# Patient Record
Sex: Male | Born: 1945 | Race: White | Hispanic: No | Marital: Married | Smoking: Former smoker
Health system: Southern US, Community
[De-identification: ages and names within clinical notes are randomized; demographics above are authoritative.]

## PROBLEM LIST (undated history)

## (undated) DIAGNOSIS — G4733 Obstructive sleep apnea (adult) (pediatric): Secondary | ICD-10-CM

## (undated) DIAGNOSIS — F419 Anxiety disorder, unspecified: Secondary | ICD-10-CM

## (undated) DIAGNOSIS — K219 Gastro-esophageal reflux disease without esophagitis: Secondary | ICD-10-CM

## (undated) DIAGNOSIS — H9319 Tinnitus, unspecified ear: Secondary | ICD-10-CM

## (undated) DIAGNOSIS — I10 Essential (primary) hypertension: Secondary | ICD-10-CM

## (undated) DIAGNOSIS — E785 Hyperlipidemia, unspecified: Secondary | ICD-10-CM

## (undated) DIAGNOSIS — J189 Pneumonia, unspecified organism: Secondary | ICD-10-CM

## (undated) DIAGNOSIS — C801 Malignant (primary) neoplasm, unspecified: Secondary | ICD-10-CM

## (undated) DIAGNOSIS — M1990 Unspecified osteoarthritis, unspecified site: Secondary | ICD-10-CM

## (undated) DIAGNOSIS — I2510 Atherosclerotic heart disease of native coronary artery without angina pectoris: Secondary | ICD-10-CM

## (undated) DIAGNOSIS — D509 Iron deficiency anemia, unspecified: Secondary | ICD-10-CM

## (undated) DIAGNOSIS — F32A Depression, unspecified: Secondary | ICD-10-CM

## (undated) DIAGNOSIS — R2689 Other abnormalities of gait and mobility: Secondary | ICD-10-CM

## (undated) DIAGNOSIS — M545 Low back pain, unspecified: Secondary | ICD-10-CM

## (undated) HISTORY — PX: DG BARIUM SWALLOW (ARMC HX): HXRAD1448

## (undated) HISTORY — PX: SIGMOIDOSCOPY: SUR1295

## (undated) HISTORY — DX: Obstructive sleep apnea (adult) (pediatric): G47.33

## (undated) HISTORY — DX: Hyperlipidemia, unspecified: E78.5

## (undated) HISTORY — DX: Atherosclerotic heart disease of native coronary artery without angina pectoris: I25.10

## (undated) HISTORY — DX: Low back pain, unspecified: M54.50

## (undated) HISTORY — DX: Other abnormalities of gait and mobility: R26.89

## (undated) HISTORY — PX: SKIN CANCER EXCISION: SHX779

## (undated) HISTORY — PX: UPPER GI ENDOSCOPY: SHX6162

---

## 1999-03-26 ENCOUNTER — Ambulatory Visit (HOSPITAL_COMMUNITY): Admission: RE | Admit: 1999-03-26 | Discharge: 1999-03-26 | Payer: Self-pay | Admitting: Cardiology

## 1999-03-29 ENCOUNTER — Encounter: Payer: Self-pay | Admitting: Thoracic Surgery (Cardiothoracic Vascular Surgery)

## 1999-04-02 ENCOUNTER — Encounter: Payer: Self-pay | Admitting: Thoracic Surgery (Cardiothoracic Vascular Surgery)

## 1999-04-02 ENCOUNTER — Inpatient Hospital Stay (HOSPITAL_COMMUNITY)
Admission: RE | Admit: 1999-04-02 | Discharge: 1999-04-07 | Payer: Self-pay | Admitting: Thoracic Surgery (Cardiothoracic Vascular Surgery)

## 1999-04-02 HISTORY — PX: CORONARY ARTERY BYPASS GRAFT: SHX141

## 1999-04-03 ENCOUNTER — Encounter: Payer: Self-pay | Admitting: Thoracic Surgery (Cardiothoracic Vascular Surgery)

## 1999-04-04 ENCOUNTER — Encounter: Payer: Self-pay | Admitting: Thoracic Surgery (Cardiothoracic Vascular Surgery)

## 1999-04-06 ENCOUNTER — Encounter: Payer: Self-pay | Admitting: Thoracic Surgery (Cardiothoracic Vascular Surgery)

## 2000-05-23 ENCOUNTER — Encounter: Admission: RE | Admit: 2000-05-23 | Discharge: 2000-05-23 | Payer: Self-pay | Admitting: General Surgery

## 2000-05-23 ENCOUNTER — Encounter: Payer: Self-pay | Admitting: General Surgery

## 2000-05-27 ENCOUNTER — Ambulatory Visit (HOSPITAL_BASED_OUTPATIENT_CLINIC_OR_DEPARTMENT_OTHER): Admission: RE | Admit: 2000-05-27 | Discharge: 2000-05-27 | Payer: Self-pay | Admitting: General Surgery

## 2000-05-27 ENCOUNTER — Encounter (INDEPENDENT_AMBULATORY_CARE_PROVIDER_SITE_OTHER): Payer: Self-pay | Admitting: *Deleted

## 2003-01-19 ENCOUNTER — Observation Stay (HOSPITAL_COMMUNITY): Admission: EM | Admit: 2003-01-19 | Discharge: 2003-01-20 | Payer: Self-pay | Admitting: Emergency Medicine

## 2003-01-19 ENCOUNTER — Encounter: Payer: Self-pay | Admitting: Emergency Medicine

## 2004-03-30 ENCOUNTER — Ambulatory Visit (HOSPITAL_COMMUNITY): Admission: RE | Admit: 2004-03-30 | Discharge: 2004-03-30 | Payer: Self-pay | Admitting: *Deleted

## 2004-06-17 ENCOUNTER — Emergency Department (HOSPITAL_COMMUNITY): Admission: EM | Admit: 2004-06-17 | Discharge: 2004-06-17 | Payer: Self-pay | Admitting: Emergency Medicine

## 2004-11-25 HISTORY — PX: FISSURECTOMY: SHX5244

## 2004-12-15 ENCOUNTER — Emergency Department (HOSPITAL_COMMUNITY): Admission: EM | Admit: 2004-12-15 | Discharge: 2004-12-15 | Payer: Self-pay | Admitting: Emergency Medicine

## 2005-03-02 ENCOUNTER — Emergency Department (HOSPITAL_COMMUNITY): Admission: EM | Admit: 2005-03-02 | Discharge: 2005-03-02 | Payer: Self-pay | Admitting: Emergency Medicine

## 2005-12-11 ENCOUNTER — Emergency Department (HOSPITAL_COMMUNITY): Admission: EM | Admit: 2005-12-11 | Discharge: 2005-12-12 | Payer: Self-pay | Admitting: Emergency Medicine

## 2007-08-07 ENCOUNTER — Encounter: Admission: RE | Admit: 2007-08-07 | Discharge: 2007-08-07 | Payer: Self-pay | Admitting: Endocrinology

## 2007-08-27 ENCOUNTER — Encounter: Admission: RE | Admit: 2007-08-27 | Discharge: 2007-08-27 | Payer: Self-pay | Admitting: Endocrinology

## 2008-04-26 ENCOUNTER — Encounter: Admission: RE | Admit: 2008-04-26 | Discharge: 2008-04-26 | Payer: Self-pay | Admitting: *Deleted

## 2008-08-29 ENCOUNTER — Encounter (INDEPENDENT_AMBULATORY_CARE_PROVIDER_SITE_OTHER): Payer: Self-pay | Admitting: *Deleted

## 2008-08-29 ENCOUNTER — Ambulatory Visit (HOSPITAL_COMMUNITY): Admission: RE | Admit: 2008-08-29 | Discharge: 2008-08-29 | Payer: Self-pay | Admitting: *Deleted

## 2010-12-15 ENCOUNTER — Encounter: Payer: Self-pay | Admitting: Cardiology

## 2011-04-09 NOTE — Op Note (Signed)
[name], Timothy             ACCOUNT NO.:  [id number]   MEDICAL RECORD NO.:  [id number]          PATIENT TYPE:  AMB   LOCATION:  ENDO                         FACILITY:  [facility]   PHYSICIAN:  Georgiana Spinner, M.D.    DATE OF BIRTH:  [birth year]   DATE OF PROCEDURE:  DATE OF DISCHARGE:                               OPERATIVE REPORT   PROCEDURE:  Colonoscopy.   INDICATIONS:  Rectal bleeding.   ANESTHESIA:  Versed 1 mg.   PROCEDURE:  With the patient mildly sedated in the left lateral  decubitus position, a rectal exam was performed, which was unremarkable.  Subsequently the Pentax videoscopic pediatric colonoscope was inserted  into the rectum and passed under direct vision to the cecum, identified  by the ileocecal valve and appendiceal orifice, both of which were  photographed.  From this point the colonoscope was slowly withdrawn,  taking circumferential views of the colonic mucosa, stopping only in the  ascending colon near the hepatic flexure where there was a small polyp  seen, photographed, and removed first using snare cautery technique,  subsequently using hot biopsy forceps to remove the remainder of the  tissue and cauterize any remaining polypoid tissue.  As noted, from this  point, the colonoscope was then slowly withdrawn, taking circumferential  views of the remaining colonic mucosa,  stopping in the rectum, which  appeared normal on direct and showed hemorrhoids on retroflexed view.  The endoscope was straightened and withdrawn.  The patient's vital  signs, pulse oximeter remained stable.  The patient tolerated the  procedure well without apparent complications.   FINDINGS:  Polyp of the ascending colon, otherwise an unremarkable  examination other than internal hemorrhoids.   PLAN:  Await biopsy report.  The patient will call me for results and  follow up with me as needed as an outpatient.           ______________________________  Georgiana Spinner, M.D.     GMO/MEDQ  D:  08/29/2008  T:  08/30/2008  Job:  [id number]

## 2011-04-09 NOTE — Op Note (Signed)
NAMERODGERS, LIKES             ACCOUNT NO.:  [id number]   MEDICAL RECORD NO.:  [id number]          PATIENT TYPE:  AMB   LOCATION:  ENDO                         [facility]ACILITY:  [facility]   PHYSICIAN:  Georgiana Spinner, M.D.    DATE OF BIRTH:  Sep 25, 1946   DATE OF PROCEDURE:  08/29/2008  DATE OF DISCHARGE:                               OPERATIVE REPORT   PROCEDURE:  Upper endoscopy.   INDICATIONS:  Gastroesophageal reflux disease, rectal bleeding.   ANESTHESIA:  Fentanyl 75 mcg, Versed 5 mg.   PROCEDURE:  With the patient mildly sedated in the left lateral  decubitus position, the Pentax videoscopic endoscope was inserted in the  mouth, passed under direct vision through the esophagus which appeared  normal into the stomach.  Fundus, body, antrum, duodenal bulb, second  portion duodenum all appeared normal.  From this point the endoscope was  slowly withdrawn taking circumferential views of duodenal mucosa until  the endoscope had been pulled back into the stomach, placed in  retroflexion to view the stomach from below.  The endoscope was  straightened and withdrawn taking circumferential views of the remaining  gastric and esophageal mucosa.  The patient's vital signs, pulse  oximeter remained stable.  The patient tolerated procedure well without  apparent complications.   FINDINGS:  Normal exam.   PLAN:  Proceed to colonoscopy.           ______________________________  Georgiana Spinner, M.D.     GMO/MEDQ  D:  08/29/2008  T:  08/30/2008  Job:  [id number]

## 2011-04-12 NOTE — Op Note (Signed)
NAME:  Timothy Gates, Timothy Gates                       ACCOUNT NO.:  [id number]   MEDICAL RECORD NO.:  [id number]                   PATIENT TYPE:  AMB   LOCATION:  ENDO                                 FACILITY:  [facility]   PHYSICIAN:  Georgiana Spinner, M.D.                 DATE OF BIRTH:  November 29, 1945   DATE OF PROCEDURE:  DATE OF DISCHARGE:                                 OPERATIVE REPORT   PROCEDURE:  Colonoscopy.   INDICATIONS:  Rectal bleeding.   ANESTHESIA:  Demerol 60 mg, Versed 6 mg.   DESCRIPTION OF PROCEDURE:  With the patient mildly sedated and in the left  lateral decubitus position, the Olympus videoscopic colonoscope was inserted  in the rectum and passed under direct vision to the cecum, identified by  ileocecal valve and appendiceal orifice, both of which were photographed.  From this point,  the colonoscope was slowly withdrawn, taking  circumferential views of the entire colonic mucosa, stopping in the rectum  on direct and retroflexed view.  The endoscope was straightened and  withdrawn.  The patient's vital signs and pulse oximetry remained stable.  The patient tolerated the procedure well without apparent complications.   FINDINGS:  Possibly internal hemorrhoids; otherwise unremarkable exam.   PLAN:  Repeat in 5-10 years.                                               Georgiana Spinner, M.D.    GMO/MEDQ  D:  03/30/2004  T:  03/30/2004  Job:  [id number]

## 2011-04-12 NOTE — Op Note (Signed)
[place]  Patient:    Timothy Gates, Timothy Gates                    MRN: [id number] Proc. Date: 05/27/00 Adm. 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 3. Anal tags.  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 3. Anal tags.  OPERATION/PROCEDURE: 1. Attempted flexible sigmoidoscopy. 2. Excision of external tags. 3. Repair of anal fissure.  ANESTHESIA:  General anesthesia with LMA.  SURGEON:  Timothy E. Earlene Plater, M.D.  INDICATION FOR PROCEDURE:  Mr. Burkhalter has been seen and followed in the office. He has a long history of recurrent anal fissure.  He has several external tags and severe pruritus ani.  He has elected to proceed with surgery after careful explanation of the procedure.  DESCRIPTION OF PROCEDURE:  The patient was brought to the operating room and placed in the supine position. IV was started.  General anesthesia was induced with LMA.  He was placed in the lithotomy position. I attempted to do a flexible sigmoidoscopy but had complete failure of the equipment. The scope was withdrawn.  The liquid stool suctioned away.  The perianal area was prepped and draped in the usual fashion.  Anal fissure was present posteriorly. Chronic heaped up rolled edges and several skin tags were present. on the perianal skin.  There was no stenosis.  Pruritus ani and hyperkeratosis of the skin was marked.  I injected 0.5% Marcaine with epinephrine around and about the anal orifice and massaged in well.  A percutaneous internal sphincterotomy was accomplished in the left posterior position without injury to the external sphincter.  Both sphincters could be easily palpated.  The external skin tags were then removed by simply cutting them smooth and flatly away from the surrounding skin. The anus was gently dilated  and the operating anoscope placed.  No pathology was noted internally. The fissure was cauterized and the cut skin where the tags had been were closed with interrupted 3-0 chromic sutures.  This left a very satisfactory external appearance.  This completed the procedure.  Gelfoam gauze and dry sterile dressing applied.  He tolerated the procedure well and was removed to the recovery room in good condition. DD:  05/27/00 TD:  05/27/00 Job: [id number]

## 2011-05-09 ENCOUNTER — Other Ambulatory Visit: Payer: Self-pay | Admitting: Dermatology

## 2011-07-11 ENCOUNTER — Other Ambulatory Visit: Payer: Self-pay | Admitting: Dermatology

## 2012-02-05 DIAGNOSIS — R310 Gross hematuria: Secondary | ICD-10-CM | POA: Diagnosis not present

## 2012-02-06 DIAGNOSIS — E789 Disorder of lipoprotein metabolism, unspecified: Secondary | ICD-10-CM | POA: Diagnosis not present

## 2012-02-13 DIAGNOSIS — I10 Essential (primary) hypertension: Secondary | ICD-10-CM | POA: Diagnosis not present

## 2012-02-13 DIAGNOSIS — E789 Disorder of lipoprotein metabolism, unspecified: Secondary | ICD-10-CM | POA: Diagnosis not present

## 2012-02-13 DIAGNOSIS — E291 Testicular hypofunction: Secondary | ICD-10-CM | POA: Diagnosis not present

## 2012-03-19 DIAGNOSIS — R5381 Other malaise: Secondary | ICD-10-CM | POA: Diagnosis not present

## 2012-03-19 DIAGNOSIS — R112 Nausea with vomiting, unspecified: Secondary | ICD-10-CM | POA: Diagnosis not present

## 2012-03-19 DIAGNOSIS — R197 Diarrhea, unspecified: Secondary | ICD-10-CM | POA: Diagnosis not present

## 2012-03-19 DIAGNOSIS — R5383 Other fatigue: Secondary | ICD-10-CM | POA: Diagnosis not present

## 2012-03-24 DIAGNOSIS — I2510 Atherosclerotic heart disease of native coronary artery without angina pectoris: Secondary | ICD-10-CM | POA: Diagnosis not present

## 2012-03-24 DIAGNOSIS — E782 Mixed hyperlipidemia: Secondary | ICD-10-CM | POA: Diagnosis not present

## 2012-04-01 ENCOUNTER — Other Ambulatory Visit: Payer: Self-pay | Admitting: Dermatology

## 2012-04-01 DIAGNOSIS — L821 Other seborrheic keratosis: Secondary | ICD-10-CM | POA: Diagnosis not present

## 2012-04-01 DIAGNOSIS — L820 Inflamed seborrheic keratosis: Secondary | ICD-10-CM | POA: Diagnosis not present

## 2012-04-01 DIAGNOSIS — C44621 Squamous cell carcinoma of skin of unspecified upper limb, including shoulder: Secondary | ICD-10-CM | POA: Diagnosis not present

## 2012-04-01 DIAGNOSIS — L570 Actinic keratosis: Secondary | ICD-10-CM | POA: Diagnosis not present

## 2012-04-13 ENCOUNTER — Emergency Department (HOSPITAL_COMMUNITY): Payer: Medicare Other

## 2012-04-13 ENCOUNTER — Encounter (HOSPITAL_COMMUNITY): Payer: Self-pay | Admitting: *Deleted

## 2012-04-13 ENCOUNTER — Emergency Department (HOSPITAL_COMMUNITY)
Admission: EM | Admit: 2012-04-13 | Discharge: 2012-04-13 | Disposition: A | Discharge status: Home / Self Care | Payer: Medicare Other | Attending: Emergency Medicine | Admitting: Emergency Medicine

## 2012-04-13 DIAGNOSIS — W19XXXA Unspecified fall, initial encounter: Secondary | ICD-10-CM | POA: Diagnosis not present

## 2012-04-13 DIAGNOSIS — Z951 Presence of aortocoronary bypass graft: Secondary | ICD-10-CM | POA: Diagnosis not present

## 2012-04-13 DIAGNOSIS — I700 Atherosclerosis of aorta: Secondary | ICD-10-CM | POA: Diagnosis not present

## 2012-04-13 DIAGNOSIS — Z79899 Other long term (current) drug therapy: Secondary | ICD-10-CM | POA: Diagnosis not present

## 2012-04-13 DIAGNOSIS — R1012 Left upper quadrant pain: Secondary | ICD-10-CM | POA: Diagnosis not present

## 2012-04-13 DIAGNOSIS — R079 Chest pain, unspecified: Secondary | ICD-10-CM | POA: Insufficient documentation

## 2012-04-13 DIAGNOSIS — S20219A Contusion of unspecified front wall of thorax, initial encounter: Secondary | ICD-10-CM | POA: Diagnosis not present

## 2012-04-13 DIAGNOSIS — Z7982 Long term (current) use of aspirin: Secondary | ICD-10-CM | POA: Insufficient documentation

## 2012-04-13 DIAGNOSIS — W1809XA Striking against other object with subsequent fall, initial encounter: Secondary | ICD-10-CM | POA: Insufficient documentation

## 2012-04-13 DIAGNOSIS — IMO0002 Reserved for concepts with insufficient information to code with codable children: Secondary | ICD-10-CM | POA: Diagnosis not present

## 2012-04-13 DIAGNOSIS — R109 Unspecified abdominal pain: Secondary | ICD-10-CM | POA: Diagnosis not present

## 2012-04-13 LAB — CBC
Hemoglobin: 13.1 g/dL (ref 13.0–17.0)
MCH: 28.9 pg (ref 26.0–34.0)
MCHC: 34.6 g/dL (ref 30.0–36.0)
MCV: 83.5 fL (ref 78.0–100.0)
RBC: 4.54 MIL/uL (ref 4.22–5.81)

## 2012-04-13 LAB — DIFFERENTIAL
Basophils Relative: 0 % (ref 0–1)
Eosinophils Absolute: 0.1 10*3/uL (ref 0.0–0.7)
Eosinophils Relative: 2 % (ref 0–5)
Lymphs Abs: 1.8 10*3/uL (ref 0.7–4.0)
Monocytes Relative: 6 % (ref 3–12)

## 2012-04-13 LAB — POCT I-STAT, CHEM 8
Calcium, Ion: 1.21 mmol/L (ref 1.12–1.32)
Creatinine, Ser: 0.8 mg/dL (ref 0.50–1.35)
Glucose, Bld: 105 mg/dL — ABNORMAL HIGH (ref 70–99)
HCT: 39 % (ref 39.0–52.0)
Hemoglobin: 13.3 g/dL (ref 13.0–17.0)
TCO2: 23 mmol/L (ref 0–100)

## 2012-04-13 MED ORDER — NAPROXEN 500 MG PO TABS: 500.0000 mg | ORAL_TABLET | Freq: Two times a day (BID) | ORAL | Status: AC

## 2012-04-13 MED ORDER — OXYCODONE-ACETAMINOPHEN 5-325 MG PO TABS: 1.0000 | ORAL_TABLET | Freq: Four times a day (QID) | ORAL | Status: AC | PRN

## 2012-04-13 MED ORDER — OXYCODONE-ACETAMINOPHEN 5-325 MG PO TABS: 1.0000 | ORAL_TABLET | Freq: Four times a day (QID) | ORAL | Status: DC | PRN

## 2012-04-13 MED ORDER — IOHEXOL 350 MG/ML SOLN
100.0000 mL | Freq: Once | INTRAVENOUS | Status: AC | PRN
  Administered 2012-04-13: 100 mL via INTRAVENOUS

## 2012-04-13 MED ORDER — HYDROMORPHONE HCL PF 1 MG/ML IJ SOLN
1.0000 mg | Freq: Once | INTRAMUSCULAR | Status: AC
  Administered 2012-04-13: 1 mg via INTRAVENOUS
  Filled 2012-04-13: qty 1

## 2012-04-13 NOTE — Discharge Instructions (Signed)
Chest Contusion You have been checked for injuries to your chest. Your caregiver has not found injuries serious enough to require hospitalization. It is common to have bruises and sore muscles after an injury. These tend to feel worse the first 24 hours. You may gradually develop more stiffness and soreness over the next several hours to several days. This usually feels worse the first morning following your injury. After a few days, you will usually begin to improve. The amount of improvement depends on the amount of damage. Following the accident, if the pain in any area continues to increase or you develop new areas of pain, you should see your primary caregiver or return to the Emergency Department for re-evaluation. HOME CARE INSTRUCTIONS   Put ice on sore areas every 2 hours for 20 minutes while awake for the next 2 days.   Drink extra fluids. Do not drink alcohol.   Activity as tolerated. Lifting may make pain worse.   Only take over-the-counter or prescription medicines for pain, discomfort, or fever as directed by your caregiver. Do not use aspirin. This may increase bruising or increase bleeding.  SEEK IMMEDIATE MEDICAL CARE IF:   There is a worsening of any of the problems that brought you in for care.   Shortness of breath, dizziness or fainting develop.   You have chest pain, difficulty breathing, or develop pain going down the left arm or up into jaw.   You feel sick to your stomach (nausea), vomiting or sweats.   You have increasing belly (abdominal) discomfort.   There is blood in your urine, stool, or if you vomit blood.   There is pain in either shoulder in an area where a shoulder strap would be.   You have feelings of lightheadedness, or if you should have a fainting episode.   You have numbness, tingling, weakness, or problems with the use of your arms or legs.   Severe headaches not relieved with medications develop.   You have a change in bowel or bladder  control.   There is increasing pain in any areas of the body.  If you feel your symptoms are worsening, and you are not able to see your primary caregiver, return to the Emergency Department immediately. MAKE SURE YOU:   Understand these instructions.   Will watch your condition.   Will get help right away if you are not doing well or get worse.  Document Released: 08/06/2001 Document Revised: 10/31/2011 Document Reviewed: 06/29/2008 Exit[employer] Patient Information 2012 [employer]. 

## 2012-04-13 NOTE — ED Provider Notes (Signed)
History     CSN: [id number]  Arrival date & time 04/13/12  1625   First MD Initiated Contact with Patient 04/13/12 1639      Chief Complaint  Patient presents with  . Fall  . Rib Injury  . Chest Injury    (Consider location/radiation/quality/duration/timing/severity/associated sxs/prior treatment) Patient is a 66 y.o. male presenting with fall. The history is provided by the patient. No language interpreter was used.  Fall The accident occurred 1 to 2 hours ago. The fall occurred while walking (fell onto a chair with a leg in the air - struck him in chest). He fell from a height of 1 to 2 ft. He landed on a hard floor. There was no blood loss. Point of impact: chest. Pain location: chest. The pain is moderate. He was ambulatory at the scene. There was no entrapment after the fall. There was no drug use involved in the accident. There was no alcohol use involved in the accident. Pertinent negatives include no fever, no numbness, no abdominal pain, no bowel incontinence, no nausea, no vomiting, no headaches, no hearing loss and no loss of consciousness. The symptoms are aggravated by activity and standing.    History reviewed. No pertinent past medical history.  History reviewed. No pertinent past surgical history.  History reviewed. No pertinent family history.  History  Substance Use Topics  . Smoking status: Former Smoker    Types: Cigarettes  . Smokeless tobacco: Not on file  . Alcohol Use: No      Review of Systems  Constitutional: Negative for fever, activity change, appetite change and fatigue.  HENT: Negative for congestion, sore throat, rhinorrhea, neck pain and neck stiffness.   Respiratory: Negative for cough, chest tightness and shortness of breath.   Cardiovascular: Positive for chest pain. Negative for palpitations.  Gastrointestinal: Negative for nausea, vomiting, abdominal pain and bowel incontinence.  Genitourinary: Negative for dysuria, urgency, frequency  and flank pain.  Musculoskeletal: Negative for myalgias, back pain and arthralgias.  Neurological: Negative for dizziness, loss of consciousness, weakness, light-headedness, numbness and headaches.  All other systems reviewed and are negative.    Allergies  Benadryl  Home Medications   Current Outpatient Rx  Name Route Sig Dispense Refill  . ASPIRIN 81 MG PO CHEW Oral Chew 81 mg by mouth daily.    . ATORVASTATIN CALCIUM 40 MG PO TABS Oral Take 40 mg by mouth daily.    . CYANOCOBALAMIN 500 MCG PO TABS Oral Take 500 mcg by mouth daily.    Marland Kitchen FINASTERIDE 5 MG PO TABS Oral Take 5 mg by mouth daily.    Marland Kitchen METOPROLOL SUCCINATE ER 50 MG PO TB24 Oral Take 25 mg by mouth daily. Take with or immediately following a meal.    . PANTOPRAZOLE SODIUM 40 MG PO TBEC Oral Take 40 mg by mouth daily.    . SERTRALINE HCL 50 MG PO TABS Oral Take 50 mg by mouth daily.    Marland Kitchen ZOLPIDEM TARTRATE ER 12.5 MG PO TBCR Oral Take 12.5 mg by mouth at bedtime as needed. For sleep    . NAPROXEN 500 MG PO TABS Oral Take 1 tablet (500 mg total) by mouth 2 (two) times daily. 30 tablet 0  . OXYCODONE-ACETAMINOPHEN 5-325 MG PO TABS Oral Take 1-2 tablets by mouth every 6 (six) hours as needed for pain. 20 tablet 0    BP 164/66  Pulse 81  Temp(Src) 98.2 F (36.8 C) (Oral)  Resp 20  SpO2 98%  Physical  Exam  Nursing note and vitals reviewed. Constitutional: He is oriented to person, place, and time. He appears well-developed and well-nourished.       Appears to be in moderate amount of pain  HENT:  Head: Normocephalic and atraumatic.  Mouth/Throat: Oropharynx is clear and moist.  Eyes: Conjunctivae and EOM are normal. Pupils are equal, round, and reactive to light.  Neck: Normal range of motion. Neck supple.  Cardiovascular: Normal rate, regular rhythm, normal heart sounds and intact distal pulses.  Exam reveals no gallop and no friction rub.   No murmur heard. Pulmonary/Chest: Effort normal and breath sounds normal. No  respiratory distress. He exhibits tenderness (L lower anterolateral chest wall and parasternal area).       Prior median sternotomy  Abdominal: Soft. Bowel sounds are normal. There is tenderness (LUQ). There is no rebound and no guarding.  Musculoskeletal: Normal range of motion. He exhibits no tenderness.  Neurological: He is alert and oriented to person, place, and time. No cranial nerve deficit.  Skin: Skin is warm and dry.       Abrasion near xiphoid process    ED Course  Procedures (including critical care time)  Labs Reviewed  CBC - Abnormal; Notable for the following:    HCT 37.9 (*)    All other components within normal limits  POCT I-STAT, CHEM 8 - Abnormal; Notable for the following:    Glucose, Bld 105 (*)    All other components within normal limits  DIFFERENTIAL   Dg Chest 2 View  04/13/2012  *RADIOLOGY REPORT*  Clinical Data: Larey Seat onto chair, left chest pain, heard a crack in his chest  CHEST - 2 VIEW  Comparison: 11/08/2008  Findings: Normal heart size post CABG. Mediastinal contours and pulmonary vascularity normal. Mild peribronchial thickening. Lungs appear clear. No pleural effusion or pneumothorax. Bones diffusely demineralized. Old healed fracture middle third left clavicle. No definite acute rib fractures seen.  IMPRESSION: Post CABG. Mild bronchitic changes. No definite evidence of acute injury.  Original Report Authenticated By: Lollie Marrow, M.D.   Ct Abdomen Pelvis W Contrast  04/13/2012  *RADIOLOGY REPORT*  Clinical Data: The patient tripped and fell onto chair.  Left-sided pain.  History of open heart surgery.  CT ABDOMEN AND PELVIS WITH CONTRAST  Technique:  Multidetector CT imaging of the abdomen and pelvis was performed following the standard protocol during bolus administration of intravenous contrast.  Contrast: OMNIPAQUE IOHEXOL 350 MG/ML SOLN  Comparison: Chest x-ray 04/13/2012  Findings: The patient has had median sternotomy.  Lung bases are clear.   Heart size appears normal.  No focal abnormality identified within the liver, spleen, pancreas, adrenal glands, or kidneys. Gallbladder is present.  The stomach, small bowel loops, mesentery have a normal appearance. Colonic loops are normal in caliber. The appendix is well seen and has a normal appearance.  There is atherosclerotic calcification of the aorta.  No aneurysm. No evidence for acute fracture.  Degenerative changes are seen in the lumbar spine.  IMPRESSION:   No evidence for acute abnormality in the abdomen or pelvis.  Original Report Authenticated By: Patterson Hammersmith, M.D.     1. Rib contusion       MDM  Rib contusion. I performed a chest x-ray which showed no evidence of pneumothorax or rib fracture. I performed a CT of the pelvis as his left upper quadrant tenderness just below the area where he was struck with the chair. There is no evidence of splenic injury.  He will be discharged home with aggressive pain control and incentive spirometry to prevent atelectatic pneumonia. Will be discharged home with Percocet and Naprosyn        Dayton Bailiff, MD 04/13/12 2031

## 2012-04-13 NOTE — ED Notes (Signed)
To ED for eval after falling onto a chair leg. Pt holding chest. States he can breath ok but can't sit up straight. Lung sounds clear. States he heard something pop when he landed.

## 2012-05-06 DIAGNOSIS — R7309 Other abnormal glucose: Secondary | ICD-10-CM | POA: Diagnosis not present

## 2012-05-06 DIAGNOSIS — E739 Lactose intolerance, unspecified: Secondary | ICD-10-CM | POA: Diagnosis not present

## 2012-05-08 DIAGNOSIS — G479 Sleep disorder, unspecified: Secondary | ICD-10-CM | POA: Diagnosis not present

## 2012-05-08 DIAGNOSIS — E789 Disorder of lipoprotein metabolism, unspecified: Secondary | ICD-10-CM | POA: Diagnosis not present

## 2012-05-08 DIAGNOSIS — I2510 Atherosclerotic heart disease of native coronary artery without angina pectoris: Secondary | ICD-10-CM | POA: Diagnosis not present

## 2012-07-25 DIAGNOSIS — Z23 Encounter for immunization: Secondary | ICD-10-CM | POA: Diagnosis not present

## 2012-08-05 DIAGNOSIS — E789 Disorder of lipoprotein metabolism, unspecified: Secondary | ICD-10-CM | POA: Diagnosis not present

## 2012-08-06 DIAGNOSIS — E789 Disorder of lipoprotein metabolism, unspecified: Secondary | ICD-10-CM | POA: Diagnosis not present

## 2012-08-13 DIAGNOSIS — E7800 Pure hypercholesterolemia, unspecified: Secondary | ICD-10-CM | POA: Diagnosis not present

## 2012-08-13 DIAGNOSIS — Z79899 Other long term (current) drug therapy: Secondary | ICD-10-CM | POA: Diagnosis not present

## 2012-08-13 DIAGNOSIS — F329 Major depressive disorder, single episode, unspecified: Secondary | ICD-10-CM | POA: Diagnosis not present

## 2012-08-13 DIAGNOSIS — I10 Essential (primary) hypertension: Secondary | ICD-10-CM | POA: Diagnosis not present

## 2012-09-30 DIAGNOSIS — E7800 Pure hypercholesterolemia, unspecified: Secondary | ICD-10-CM | POA: Diagnosis not present

## 2013-02-12 DIAGNOSIS — E7800 Pure hypercholesterolemia, unspecified: Secondary | ICD-10-CM | POA: Diagnosis not present

## 2013-02-12 DIAGNOSIS — G4733 Obstructive sleep apnea (adult) (pediatric): Secondary | ICD-10-CM | POA: Diagnosis not present

## 2013-02-12 DIAGNOSIS — I10 Essential (primary) hypertension: Secondary | ICD-10-CM | POA: Diagnosis not present

## 2013-02-12 DIAGNOSIS — I2510 Atherosclerotic heart disease of native coronary artery without angina pectoris: Secondary | ICD-10-CM | POA: Diagnosis not present

## 2013-02-12 DIAGNOSIS — Z87891 Personal history of nicotine dependence: Secondary | ICD-10-CM | POA: Diagnosis not present

## 2013-02-12 DIAGNOSIS — Z1212 Encounter for screening for malignant neoplasm of rectum: Secondary | ICD-10-CM | POA: Diagnosis not present

## 2013-02-23 DIAGNOSIS — R310 Gross hematuria: Secondary | ICD-10-CM | POA: Diagnosis not present

## 2013-02-25 DIAGNOSIS — K219 Gastro-esophageal reflux disease without esophagitis: Secondary | ICD-10-CM | POA: Diagnosis not present

## 2013-02-25 DIAGNOSIS — K625 Hemorrhage of anus and rectum: Secondary | ICD-10-CM | POA: Diagnosis not present

## 2013-02-25 DIAGNOSIS — K6289 Other specified diseases of anus and rectum: Secondary | ICD-10-CM | POA: Diagnosis not present

## 2013-02-25 DIAGNOSIS — K602 Anal fissure, unspecified: Secondary | ICD-10-CM | POA: Diagnosis not present

## 2013-02-25 DIAGNOSIS — K5900 Constipation, unspecified: Secondary | ICD-10-CM | POA: Diagnosis not present

## 2013-03-11 DIAGNOSIS — I2510 Atherosclerotic heart disease of native coronary artery without angina pectoris: Secondary | ICD-10-CM | POA: Diagnosis not present

## 2013-03-11 DIAGNOSIS — G4730 Sleep apnea, unspecified: Secondary | ICD-10-CM | POA: Diagnosis not present

## 2013-03-11 DIAGNOSIS — G4700 Insomnia, unspecified: Secondary | ICD-10-CM | POA: Diagnosis not present

## 2013-03-19 DIAGNOSIS — I2510 Atherosclerotic heart disease of native coronary artery without angina pectoris: Secondary | ICD-10-CM | POA: Diagnosis not present

## 2013-03-19 DIAGNOSIS — Z951 Presence of aortocoronary bypass graft: Secondary | ICD-10-CM | POA: Diagnosis not present

## 2013-03-19 DIAGNOSIS — I10 Essential (primary) hypertension: Secondary | ICD-10-CM | POA: Diagnosis not present

## 2013-04-29 DIAGNOSIS — Z1211 Encounter for screening for malignant neoplasm of colon: Secondary | ICD-10-CM | POA: Diagnosis not present

## 2013-04-29 DIAGNOSIS — K219 Gastro-esophageal reflux disease without esophagitis: Secondary | ICD-10-CM | POA: Diagnosis not present

## 2013-04-29 DIAGNOSIS — K5900 Constipation, unspecified: Secondary | ICD-10-CM | POA: Diagnosis not present

## 2013-04-29 DIAGNOSIS — K625 Hemorrhage of anus and rectum: Secondary | ICD-10-CM | POA: Diagnosis not present

## 2013-05-13 DIAGNOSIS — D509 Iron deficiency anemia, unspecified: Secondary | ICD-10-CM | POA: Diagnosis not present

## 2013-06-10 ENCOUNTER — Encounter: Payer: Self-pay | Admitting: Cardiovascular Disease

## 2013-06-10 ENCOUNTER — Ambulatory Visit (INDEPENDENT_AMBULATORY_CARE_PROVIDER_SITE_OTHER): Payer: Medicare Other | Admitting: Cardiovascular Disease

## 2013-06-10 VITALS — BP 128/64 | HR 64 | Ht 67.0 in | Wt 164.1 lb

## 2013-06-10 DIAGNOSIS — R0789 Other chest pain: Secondary | ICD-10-CM | POA: Diagnosis not present

## 2013-06-10 DIAGNOSIS — I2510 Atherosclerotic heart disease of native coronary artery without angina pectoris: Secondary | ICD-10-CM

## 2013-06-10 DIAGNOSIS — Z9989 Dependence on other enabling machines and devices: Secondary | ICD-10-CM | POA: Insufficient documentation

## 2013-06-10 DIAGNOSIS — E785 Hyperlipidemia, unspecified: Secondary | ICD-10-CM | POA: Diagnosis not present

## 2013-06-10 DIAGNOSIS — G4733 Obstructive sleep apnea (adult) (pediatric): Secondary | ICD-10-CM | POA: Insufficient documentation

## 2013-06-10 DIAGNOSIS — Z951 Presence of aortocoronary bypass graft: Secondary | ICD-10-CM

## 2013-06-10 NOTE — Assessment & Plan Note (Signed)
Overall his current lipid profile is acceptable with an LDL of 73 mg/dL, but his HDL is still quite low. His main proof some if he loses weight and increases the frequency of his exercise. His exercise prescription should wait until we clarify whether or not he has ischemic myocardium

## 2013-06-10 NOTE — Patient Instructions (Addendum)
Your physician recommends that you schedule a follow-up appointment in: 1 year. If your stress test is abnpormal we will arrange earlier follow up. Your physician has requested that you have a lexiscan myoview. For further information please visit [email or web address] Please follow instruction sheet, as given.

## 2013-06-10 NOTE — Assessment & Plan Note (Signed)
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 They appear to related to exertion and resolve with rest. Unfortunately his knees prevents him from walking on a treadmill. I have recommended that he undergo a Lexiscan Myoview study. If the nuclear perfusion images show sizable areas of reversible deficit, he should undergo repeat coronary angiography . He is already on beta blocker therapy. This is in the maximum tolerated dose (actually had to be decreased because of complaints of fatigue and weakness). Long-acting nitrates do not appear to be indicated since the symptoms are not yet that severe. He should continue taking aspirin and statin.

## 2013-06-10 NOTE — Progress Notes (Signed)
Patient ID: Timothy Gates, male   DOB: 05-Nov-1946, 67 y.o.   MRN: [id number]     Reason for office visit Chest tightness  Mrs. Ron is now 14 years status post bypass surgery and recently has began experiencing exertional chest tightness. This occurred while he was working in the yard recently. It resolved fairly promptly with rest. It has occurred inconsistently with similar levels of exercise. He denies any shortness of breath intermittent claudication lower extremity edema dizziness syncope or palpitations. He remains quite active but is having more difficulties with knee pain which limits his ability to walk longer distances.    Allergies  Allergen Reactions  . Benadryl (Diphenhydramine Hcl) Other (See Comments)    Head ache    Current Outpatient Prescriptions  Medication Sig Dispense Refill  . aspirin 81 MG chewable tablet Chew 81 mg by mouth daily.      Marland Kitchen atorvastatin (LIPITOR) 40 MG tablet Take 40 mg by mouth daily.      . cyanocobalamin 500 MCG tablet Take 500 mcg by mouth daily.      . finasteride (PROSCAR) 5 MG tablet Take 5 mg by mouth daily.      . metoprolol succinate (TOPROL-XL) 50 MG 24 hr tablet Take 25 mg by mouth daily. Take with or immediately following a meal.      . pantoprazole (PROTONIX) 40 MG tablet Take 40 mg by mouth daily.      . sertraline (ZOLOFT) 50 MG tablet Take 50 mg by mouth daily.      Marland Kitchen zolpidem (AMBIEN CR) 12.5 MG CR tablet Take 10 mg by mouth at bedtime as needed. For sleep       No current facility-administered medications for this visit.    Past Medical History  Diagnosis Date  . CAD (coronary artery disease)   . OSA (obstructive sleep apnea)   . Hyperlipidemia     Past Surgical History  Procedure Laterality Date  . Coronary artery bypass graft  04/02/1999  . Fissurectomy  2006    Family History  Problem Relation Age of Onset  . Cancer Father   . Diabetes Mother   . Cancer Sister   . Cancer Sister     History   Social  History  . Marital Status: Married    Spouse Name: N/A    Number of Children: N/A  . Years of Education: N/A   Occupational History  . Not on file.   Social History Main Topics  . Smoking status: Former Smoker    Types: Cigarettes  . Smokeless tobacco: Former Neurosurgeon    Quit date: 11/24/1992  . Alcohol Use: No  . Drug Use: No  . Sexually Active: Not on file   Other Topics Concern  . Not on file   Social History Narrative  . No narrative on file    Review of systems: The patient specifically denies any chest pain at rest, dyspnea at rest or with exertion, orthopnea, paroxysmal nocturnal dyspnea, syncope, palpitations, focal neurological deficits, intermittent claudication, lower extremity edema, unexplained weight gain, cough, hemoptysis or wheezing.  The patient also denies abdominal pain, nausea, vomiting, dysphagia, diarrhea, constipation, polyuria, polydipsia, dysuria, hematuria, frequency, urgency, abnormal bleeding or bruising, fever, chills, unexpected weight changes, mood swings, change in skin or hair texture, change in voice quality, auditory or visual problems, allergic reactions or rashes,  he has worsening arthralgias especially in his knees and lower back  PHYSICAL EXAM BP 128/64  Pulse 64  Ht 5\' 7"  (  1.702 m)  Wt 164 lb 1.6 oz (74.435 kg)  BMI 25.7 kg/m2  General: Alert, oriented x3, no distress Head: no evidence of trauma, PERRL, EOMI, no exophtalmos or lid lag, no myxedema, no xanthelasma; normal ears, nose and oropharynx Neck: normal jugular venous pulsations and no hepatojugular reflux; brisk carotid pulses without delay and no carotid bruits Chest: clear to auscultation, no signs of consolidation by percussion or palpation, normal fremitus, symmetrical and full respiratory excursions; healed sternotomy scar Cardiovascular: normal position and quality of the apical impulse, regular rhythm, normal first and second heart sounds, no murmurs, rubs or  gallops Abdomen: no tenderness or distention, no masses by palpation, no abnormal pulsatility or arterial bruits, normal bowel sounds, no hepatosplenomegaly Extremities: no clubbing, cyanosis or edema; 2+ radial, ulnar and brachial pulses bilaterally; 2+ right femoral, posterior tibial and dorsalis pedis pulses; 2+ left femoral, posterior tibial and dorsalis pedis pulses; no subclavian or femoral bruits Neurological: grossly nonfocal   EKG: Sinus rhythm, pre-existing right bundle branch block no acute repolarization abnormalities  Lipid Panel  March 2014 total cholesterol 127, tragus was 120, HDL 30, LDL 73, normal liver function tests, creatinine 1.0, normal electrolytes, normal CBC  BMET    Component Value Date/Time   NA 140 04/13/2012 1828   K 4.4 04/13/2012 1828   CL 105 04/13/2012 1828   GLUCOSE 105* 04/13/2012 1828   BUN 10 04/13/2012 1828   CREATININE 0.80 04/13/2012 1828     ASSESSMENT AND PLAN CAD (coronary artery disease) It is now roughly 14 years since his bypass procedure and this is the first time that he has began experiencing symptoms compatible with angina pectoris. They appear to related to exertion and resolve with rest. Unfortunately his knees prevents him from walking on a treadmill. I have recommended that he undergo a Lexiscan Myoview study. If the nuclear perfusion images show sizable areas of reversible deficit, he should undergo repeat coronary angiography . He is already on beta blocker therapy. This is in the maximum tolerated dose (actually had to be decreased because of complaints of fatigue and weakness). Long-acting nitrates do not appear to be indicated since the symptoms are not yet that severe. He should continue taking aspirin and statin.   Hyperlipidemia Overall his current lipid profile is acceptable with an LDL of 73 mg/dL, but his HDL is still quite low. His main proof some if he loses weight and increases the frequency of his exercise. His exercise  prescription should wait until we clarify whether or not he has ischemic myocardium  OSA on CPAP  He reports good compliance with therapy. It is recommended that he lose the weight that he has gained over the last year.   Orders Placed This Encounter  Procedures  . Myocardial Perfusion Imaging  . EKG 12-Lead   No orders of the defined types were placed in this encounter.    Junious Silk, MD, [facility] [phone] office [phone] pager

## 2013-06-10 NOTE — Assessment & Plan Note (Signed)
He reports good compliance with therapy. It is recommended that he lose the weight that he has gained over the last year.

## 2013-06-18 ENCOUNTER — Ambulatory Visit (HOSPITAL_COMMUNITY)
Admission: RE | Admit: 2013-06-18 | Discharge: 2013-06-18 | Disposition: A | Discharge status: Home / Self Care | Payer: Medicare Other | Source: Ambulatory Visit | Attending: Cardiovascular Disease | Admitting: Cardiovascular Disease

## 2013-06-18 DIAGNOSIS — R0789 Other chest pain: Secondary | ICD-10-CM

## 2013-06-18 DIAGNOSIS — Z8249 Family history of ischemic heart disease and other diseases of the circulatory system: Secondary | ICD-10-CM | POA: Diagnosis not present

## 2013-06-18 DIAGNOSIS — Z951 Presence of aortocoronary bypass graft: Secondary | ICD-10-CM | POA: Diagnosis not present

## 2013-06-18 DIAGNOSIS — E663 Overweight: Secondary | ICD-10-CM | POA: Diagnosis not present

## 2013-06-18 DIAGNOSIS — I2510 Atherosclerotic heart disease of native coronary artery without angina pectoris: Secondary | ICD-10-CM

## 2013-06-18 DIAGNOSIS — Z87891 Personal history of nicotine dependence: Secondary | ICD-10-CM | POA: Diagnosis not present

## 2013-06-18 DIAGNOSIS — R079 Chest pain, unspecified: Secondary | ICD-10-CM

## 2013-06-18 MED ORDER — TECHNETIUM TC 99M SESTAMIBI GENERIC - CARDIOLITE
31.6000 | Freq: Once | INTRAVENOUS | Status: AC | PRN
  Administered 2013-06-18: 32 via INTRAVENOUS

## 2013-06-18 MED ORDER — TECHNETIUM TC 99M SESTAMIBI GENERIC - CARDIOLITE
10.2000 | Freq: Once | INTRAVENOUS | Status: AC | PRN
  Administered 2013-06-18: 10 via INTRAVENOUS

## 2013-06-18 MED ORDER — REGADENOSON 0.4 MG/5ML IV SOLN
0.4000 mg | Freq: Once | INTRAVENOUS | Status: AC
  Administered 2013-06-18: 0.4 mg via INTRAVENOUS

## 2013-06-18 NOTE — Procedures (Addendum)
[facility] [address] [place] [address] [phone]  Cardiology Nuclear Med Study  Timothy Gates is a 67 y.o. male     MRN : [id number]     DOB: 1946-03-28  Procedure Date: 06/18/2013  Nuclear Med Background Indication for Stress Test:  Graft Patency History:  CAD;CABG X2 --2000 Cardiac Risk Factors: Family History - CAD, History of Smoking, Lipids and Overweight  Symptoms:  Chest Pain, Fatigue and Light-Headedness   Nuclear Pre-Procedure Caffeine/Decaff Intake:  7:00pm NPO After: 5:00am   IV Site: R Hand  IV 0.9% NS with Angio Cath:  22g  Chest Size (in):  42"  IV Started by: Emmit Pomfret, RN  Height: 5\' 7"  (1.702 m)  Cup Size: n/a  BMI:  Body mass index is 25.68 kg/(m^2). Weight:  164 lb (74.39 kg)   Tech Comments:  N/A    Nuclear Med Study 1 or 2 day study: 1 day  Stress Test Type:  Lexiscan  Order Authorizing Provider:  Thurmon Fair, MD   Resting Radionuclide: Technetium 6m Sestamibi  Resting Radionuclide Dose: 10.2 mCi   Stress Radionuclide:  Technetium 56m Sestamibi  Stress Radionuclide Dose: 31.6 mCi           Stress Protocol Rest HR: 53 Stress HR: 60  Rest BP: 145/81 Stress BP: 138/78  Exercise Time (min): n/a METS: n/a   Predicted Max HR: 153 bpm % Max HR: 49.02 bpm Rate Pressure Product: 21308  Dose of Adenosine (mg):  n/a Dose of Lexiscan: 0.4 mg  Dose of Atropine (mg): n/a Dose of Dobutamine: n/a mcg/kg/min (at max HR)  Stress Test Technologist: Esperanza Sheets, CCT Nuclear Technologist: Gonzella Lex, CNMT   Rest Procedure:  Myocardial perfusion imaging was performed at rest 45 minutes following the intravenous administration of Technetium 17m Sestamibi. Stress Procedure:  The patient received IV Lexiscan 0.4 mg over 15-seconds.  Technetium 12m Sestamibi injected at 30-seconds.  There were no significant changes with Lexiscan.  Quantitative spect images were obtained after a 45  minute delay.  Transient Ischemic Dilatation (Normal <1.22):  1.10 Lung/Heart Ratio (Normal <0.45):  0.29 QGS EDV:  72 ml QGS ESV:  27 ml LV Ejection Fraction: 62%     Rest ECG: NSR-RBBB  Stress ECG: No significant change from baseline ECG  QPS Raw Data Images:  Normal; no motion artifact; normal heart/lung ratio. Stress Images:  Normal homogeneous uptake in all areas of the myocardium. Rest Images:  Normal homogeneous uptake in all areas of the myocardium. Subtraction (SDS):  No evidence of ischemia.  Impression Exercise Capacity:  Lexiscan with no exercise. BP Response:  Normal blood pressure response. Clinical Symptoms:  No significant symptoms noted. ECG Impression:  No significant ST segment change suggestive of ischemia. Comparison with Prior Nuclear Study: No significant change from previous study  Overall Impression:  Normal stress nuclear study.  LV Wall Motion:  NL LV Function; NL Wall Motion   Gregorey Nabor, MD  06/18/2013 2:43 PM

## 2013-08-06 DIAGNOSIS — N400 Enlarged prostate without lower urinary tract symptoms: Secondary | ICD-10-CM | POA: Diagnosis not present

## 2013-08-06 DIAGNOSIS — I2510 Atherosclerotic heart disease of native coronary artery without angina pectoris: Secondary | ICD-10-CM | POA: Diagnosis not present

## 2013-08-06 DIAGNOSIS — G4730 Sleep apnea, unspecified: Secondary | ICD-10-CM | POA: Diagnosis not present

## 2013-08-06 DIAGNOSIS — E789 Disorder of lipoprotein metabolism, unspecified: Secondary | ICD-10-CM | POA: Diagnosis not present

## 2013-08-10 DIAGNOSIS — Z79899 Other long term (current) drug therapy: Secondary | ICD-10-CM | POA: Diagnosis not present

## 2013-08-10 DIAGNOSIS — R5381 Other malaise: Secondary | ICD-10-CM | POA: Diagnosis not present

## 2013-08-10 DIAGNOSIS — E789 Disorder of lipoprotein metabolism, unspecified: Secondary | ICD-10-CM | POA: Diagnosis not present

## 2013-08-18 DIAGNOSIS — Z23 Encounter for immunization: Secondary | ICD-10-CM | POA: Diagnosis not present

## 2013-09-16 DIAGNOSIS — M79609 Pain in unspecified limb: Secondary | ICD-10-CM | POA: Diagnosis not present

## 2013-09-28 ENCOUNTER — Other Ambulatory Visit: Payer: Self-pay | Admitting: Dermatology

## 2013-09-28 DIAGNOSIS — L821 Other seborrheic keratosis: Secondary | ICD-10-CM | POA: Diagnosis not present

## 2013-09-28 DIAGNOSIS — Z85828 Personal history of other malignant neoplasm of skin: Secondary | ICD-10-CM | POA: Diagnosis not present

## 2013-09-28 DIAGNOSIS — D0460 Carcinoma in situ of skin of unspecified upper limb, including shoulder: Secondary | ICD-10-CM | POA: Diagnosis not present

## 2013-09-28 DIAGNOSIS — L570 Actinic keratosis: Secondary | ICD-10-CM | POA: Diagnosis not present

## 2013-09-28 DIAGNOSIS — D485 Neoplasm of uncertain behavior of skin: Secondary | ICD-10-CM | POA: Diagnosis not present

## 2013-09-28 DIAGNOSIS — L819 Disorder of pigmentation, unspecified: Secondary | ICD-10-CM | POA: Diagnosis not present

## 2014-02-18 DIAGNOSIS — E789 Disorder of lipoprotein metabolism, unspecified: Secondary | ICD-10-CM | POA: Diagnosis not present

## 2014-02-18 DIAGNOSIS — K219 Gastro-esophageal reflux disease without esophagitis: Secondary | ICD-10-CM | POA: Diagnosis not present

## 2014-02-18 DIAGNOSIS — I10 Essential (primary) hypertension: Secondary | ICD-10-CM | POA: Diagnosis not present

## 2014-02-18 DIAGNOSIS — Z79899 Other long term (current) drug therapy: Secondary | ICD-10-CM | POA: Diagnosis not present

## 2014-02-22 DIAGNOSIS — K5900 Constipation, unspecified: Secondary | ICD-10-CM | POA: Diagnosis not present

## 2014-02-22 DIAGNOSIS — L290 Pruritus ani: Secondary | ICD-10-CM | POA: Diagnosis not present

## 2014-02-22 DIAGNOSIS — Z1211 Encounter for screening for malignant neoplasm of colon: Secondary | ICD-10-CM | POA: Diagnosis not present

## 2014-02-22 DIAGNOSIS — K219 Gastro-esophageal reflux disease without esophagitis: Secondary | ICD-10-CM | POA: Diagnosis not present

## 2014-02-24 DIAGNOSIS — E291 Testicular hypofunction: Secondary | ICD-10-CM | POA: Diagnosis not present

## 2014-02-24 DIAGNOSIS — Z0000 Encounter for general adult medical examination without abnormal findings: Secondary | ICD-10-CM | POA: Diagnosis not present

## 2014-02-24 DIAGNOSIS — I2510 Atherosclerotic heart disease of native coronary artery without angina pectoris: Secondary | ICD-10-CM | POA: Diagnosis not present

## 2014-02-24 DIAGNOSIS — Z23 Encounter for immunization: Secondary | ICD-10-CM | POA: Diagnosis not present

## 2014-02-24 DIAGNOSIS — I10 Essential (primary) hypertension: Secondary | ICD-10-CM | POA: Diagnosis not present

## 2014-02-24 DIAGNOSIS — N400 Enlarged prostate without lower urinary tract symptoms: Secondary | ICD-10-CM | POA: Diagnosis not present

## 2014-02-24 DIAGNOSIS — E7800 Pure hypercholesterolemia, unspecified: Secondary | ICD-10-CM | POA: Diagnosis not present

## 2014-02-25 ENCOUNTER — Encounter: Payer: Self-pay | Admitting: Cardiovascular Disease

## 2014-06-17 ENCOUNTER — Ambulatory Visit (INDEPENDENT_AMBULATORY_CARE_PROVIDER_SITE_OTHER): Payer: Medicare Other | Admitting: Cardiovascular Disease

## 2014-06-17 ENCOUNTER — Encounter: Payer: Self-pay | Admitting: Cardiovascular Disease

## 2014-06-17 VITALS — BP 140/60 | HR 63 | Resp 16 | Ht 66.0 in | Wt 155.7 lb

## 2014-06-17 DIAGNOSIS — G4733 Obstructive sleep apnea (adult) (pediatric): Secondary | ICD-10-CM

## 2014-06-17 DIAGNOSIS — E785 Hyperlipidemia, unspecified: Secondary | ICD-10-CM | POA: Diagnosis not present

## 2014-06-17 DIAGNOSIS — I2510 Atherosclerotic heart disease of native coronary artery without angina pectoris: Secondary | ICD-10-CM | POA: Diagnosis not present

## 2014-06-17 DIAGNOSIS — Z9989 Dependence on other enabling machines and devices: Secondary | ICD-10-CM

## 2014-06-17 NOTE — Patient Instructions (Signed)
Your physician recommends that you schedule a follow-up appointment in: 1 year. No  Change were made in your therapy today.

## 2014-06-17 NOTE — Progress Notes (Signed)
Patient ID: Timothy Gates, male   DOB: 04/25/1946, 68 y.o.   MRN: [id number]      Reason for office visit CAD s/p CABG  Timothy Gates is now 15 years status post bypass surgery and feels well. He periodically walks at the sleep Center for roughly a mile to a mile and a half and sometimes walks around the block. He can generally do this without dyspnea. He never experiences exertional chest pain. He has an occasional fleeting sharp parasternal twinge that occurs at rest. He complains of stiffness and pain in his knees but does not have intermittent claudication. He will have his lipid profile performed in October when he sees Dr. Wilson Singer. He has lost almost 9 pounds since last year and now is at a healthy weight. Hopefully this will lead to improvement in his HDL cholesterol.  In 2000 Dr. Roxan Hockey performed a sequential LIMA to the diagonal and LAD artery bypass procedure. In 2014 he had a normal Lexiscan Myoview.   Allergies  Allergen Reactions  . Benadryl [Diphenhydramine Hcl] Other (See Comments)    Head ache    Current Outpatient Prescriptions  Medication Sig Dispense Refill  . acetaminophen (TYLENOL) 325 MG tablet Take 650 mg by mouth every 6 (six) hours as needed.      Marland Kitchen aspirin 81 MG chewable tablet Chew 81 mg by mouth daily.      Marland Kitchen atorvastatin (LIPITOR) 40 MG tablet Take 40 mg by mouth daily.       . cyanocobalamin 500 MCG tablet Take 500 mcg by mouth daily.      . finasteride (PROSCAR) 5 MG tablet Take 5 mg by mouth daily.      . metoprolol (LOPRESSOR) 50 MG tablet Take 25 mg by mouth daily.      . metoprolol succinate (TOPROL-XL) 50 MG 24 hr tablet Take 25 mg by mouth daily. Take with or immediately following a meal.      . pantoprazole (PROTONIX) 40 MG tablet Take 40 mg by mouth daily.      . sertraline (ZOLOFT) 50 MG tablet Take 50 mg by mouth daily.      Marland Kitchen zolpidem (AMBIEN CR) 12.5 MG CR tablet Take 10 mg by mouth at bedtime as needed. For sleep       No current  facility-administered medications for this visit.    Past Medical History  Diagnosis Date  . CAD (coronary artery disease)   . OSA (obstructive sleep apnea)   . Hyperlipidemia     Past Surgical History  Procedure Laterality Date  . Coronary artery bypass graft  04/02/1999  . Fissurectomy  2006    Family History  Problem Relation Age of Onset  . Cancer Father   . Diabetes Mother   . Cancer Sister   . Cancer Sister     History   Social History  . Marital Status: Married    Spouse Name: N/A    Number of Children: N/A  . Years of Education: N/A   Occupational History  . Not on file.   Social History Main Topics  . Smoking status: Former Smoker    Types: Cigarettes  . Smokeless tobacco: Former Systems developer    Quit date: 11/24/1992  . Alcohol Use: No  . Drug Use: No  . Sexual Activity: Not on file   Other Topics Concern  . Not on file   Social History Narrative  . No narrative on file    Review of systems: The patient specifically  denies any chest pain at rest or with exertion, dyspnea at rest or with exertion, orthopnea, paroxysmal nocturnal dyspnea, syncope, palpitations, focal neurological deficits, intermittent claudication, lower extremity edema, unexplained weight gain, cough, hemoptysis or wheezing.  The patient also denies abdominal pain, nausea, vomiting, dysphagia, diarrhea, constipation, polyuria, polydipsia, dysuria, hematuria, frequency, urgency, abnormal bleeding or bruising, fever, chills, unexpected weight changes, mood swings, change in skin or hair texture, change in voice quality, auditory or visual problems, allergic reactions or rashes, new musculoskeletal complaints other than usual "aches and pains".   PHYSICAL EXAM BP 140/60  Pulse 63  Resp 16  Ht 5\' 6"  (1.676 m)  Wt 155 lb 11.2 oz (70.625 kg)  BMI 25.14 kg/m2  General: Alert, oriented x3, no distress Head: no evidence of trauma, PERRL, EOMI, no exophtalmos or lid lag, no myxedema, no  xanthelasma; normal ears, nose and oropharynx Neck: normal jugular venous pulsations and no hepatojugular reflux; brisk carotid pulses without delay and no carotid bruits Chest: clear to auscultation, no signs of consolidation by percussion or palpation, normal fremitus, symmetrical and full respiratory excursions Cardiovascular: normal position and quality of the apical impulse, regular rhythm, normal first and second heart sounds, no murmurs, rubs or gallops Abdomen: no tenderness or distention, no masses by palpation, no abnormal pulsatility or arterial bruits, normal bowel sounds, no hepatosplenomegaly Extremities: no clubbing, cyanosis or edema; 2+ radial, ulnar and brachial pulses bilaterally; 2+ right femoral, posterior tibial and dorsalis pedis pulses; 2+ left femoral, posterior tibial and dorsalis pedis pulses; no subclavian or femoral bruits Neurological: grossly nonfocal   EKG: Normal sinus rhythm, RBBB, no change  Lipid Panel  2014 total cholesterol 127, TG 120, HDL 30, LDL 73   BMET    Component Value Date/Time   NA 140 04/13/2012 1828   K 4.4 04/13/2012 1828   CL 105 04/13/2012 1828   GLUCOSE 105* 04/13/2012 1828   BUN 10 04/13/2012 1828   CREATININE 0.80 04/13/2012 1828     ASSESSMENT AND PLAN  CAD (coronary artery disease) s/p CABG (sequential LIMA to the diagonal and LAD performed in 2000) No symptoms of angina pectoris. Normal myocardial perfusion study in 2014. Hyperlipidemia  Overall his current lipid profile is acceptable with an LDL of 73 mg/dL, but his HDL is still quite low. He has lost weight. Hopefully we'll see some improvement in HDL.  OSA on CPAP  Congratulated on weight loss. Encouraged to keep on exercising and maintaining healthy weight.  Meds ordered this encounter  Medications  . acetaminophen (TYLENOL) 325 MG tablet    Sig: Take 650 mg by mouth every 6 (six) hours as needed.  . metoprolol (LOPRESSOR) 50 MG tablet    Sig: Take 25 mg by mouth daily.     Holli Humbles, MD, [place] [phone] office [phone] pager

## 2014-08-26 DIAGNOSIS — E780 Pure hypercholesterolemia: Secondary | ICD-10-CM | POA: Diagnosis not present

## 2014-08-26 DIAGNOSIS — E756 Lipid storage disorder, unspecified: Secondary | ICD-10-CM | POA: Diagnosis not present

## 2014-08-26 DIAGNOSIS — I10 Essential (primary) hypertension: Secondary | ICD-10-CM | POA: Diagnosis not present

## 2014-09-02 DIAGNOSIS — D649 Anemia, unspecified: Secondary | ICD-10-CM | POA: Diagnosis not present

## 2014-09-02 DIAGNOSIS — E789 Disorder of lipoprotein metabolism, unspecified: Secondary | ICD-10-CM | POA: Diagnosis not present

## 2014-09-02 DIAGNOSIS — I2510 Atherosclerotic heart disease of native coronary artery without angina pectoris: Secondary | ICD-10-CM | POA: Diagnosis not present

## 2014-09-02 DIAGNOSIS — I10 Essential (primary) hypertension: Secondary | ICD-10-CM | POA: Diagnosis not present

## 2014-11-21 DIAGNOSIS — H905 Unspecified sensorineural hearing loss: Secondary | ICD-10-CM | POA: Diagnosis not present

## 2014-11-21 DIAGNOSIS — H9193 Unspecified hearing loss, bilateral: Secondary | ICD-10-CM | POA: Diagnosis not present

## 2014-11-21 DIAGNOSIS — H9312 Tinnitus, left ear: Secondary | ICD-10-CM | POA: Diagnosis not present

## 2014-11-21 DIAGNOSIS — J342 Deviated nasal septum: Secondary | ICD-10-CM | POA: Diagnosis not present

## 2015-03-03 DIAGNOSIS — E291 Testicular hypofunction: Secondary | ICD-10-CM | POA: Diagnosis not present

## 2015-03-03 DIAGNOSIS — E780 Pure hypercholesterolemia: Secondary | ICD-10-CM | POA: Diagnosis not present

## 2015-03-03 DIAGNOSIS — I10 Essential (primary) hypertension: Secondary | ICD-10-CM | POA: Diagnosis not present

## 2015-03-03 DIAGNOSIS — E756 Lipid storage disorder, unspecified: Secondary | ICD-10-CM | POA: Diagnosis not present

## 2015-03-03 DIAGNOSIS — N400 Enlarged prostate without lower urinary tract symptoms: Secondary | ICD-10-CM | POA: Diagnosis not present

## 2015-03-03 DIAGNOSIS — Z125 Encounter for screening for malignant neoplasm of prostate: Secondary | ICD-10-CM | POA: Diagnosis not present

## 2015-03-09 ENCOUNTER — Encounter: Payer: Self-pay | Admitting: Cardiovascular Disease

## 2015-03-09 DIAGNOSIS — E780 Pure hypercholesterolemia: Secondary | ICD-10-CM | POA: Diagnosis not present

## 2015-03-09 DIAGNOSIS — G4700 Insomnia, unspecified: Secondary | ICD-10-CM | POA: Diagnosis not present

## 2015-03-09 DIAGNOSIS — F419 Anxiety disorder, unspecified: Secondary | ICD-10-CM | POA: Diagnosis not present

## 2015-03-09 DIAGNOSIS — I10 Essential (primary) hypertension: Secondary | ICD-10-CM | POA: Diagnosis not present

## 2015-04-13 DIAGNOSIS — J45909 Unspecified asthma, uncomplicated: Secondary | ICD-10-CM | POA: Diagnosis not present

## 2015-04-17 DIAGNOSIS — Z85820 Personal history of malignant melanoma of skin: Secondary | ICD-10-CM | POA: Diagnosis not present

## 2015-04-17 DIAGNOSIS — M25562 Pain in left knee: Secondary | ICD-10-CM | POA: Diagnosis not present

## 2015-04-17 DIAGNOSIS — I7090 Unspecified atherosclerosis: Secondary | ICD-10-CM | POA: Diagnosis not present

## 2015-04-17 DIAGNOSIS — M79606 Pain in leg, unspecified: Secondary | ICD-10-CM | POA: Diagnosis not present

## 2015-04-17 DIAGNOSIS — M25561 Pain in right knee: Secondary | ICD-10-CM | POA: Diagnosis not present

## 2015-04-17 DIAGNOSIS — M2550 Pain in unspecified joint: Secondary | ICD-10-CM | POA: Diagnosis not present

## 2015-06-28 ENCOUNTER — Ambulatory Visit (INDEPENDENT_AMBULATORY_CARE_PROVIDER_SITE_OTHER): Payer: Medicare Other | Admitting: Cardiovascular Disease

## 2015-06-28 VITALS — BP 110/60 | HR 69 | Resp 16 | Ht 66.0 in | Wt 155.0 lb

## 2015-06-28 DIAGNOSIS — I2510 Atherosclerotic heart disease of native coronary artery without angina pectoris: Secondary | ICD-10-CM

## 2015-06-28 DIAGNOSIS — E785 Hyperlipidemia, unspecified: Secondary | ICD-10-CM | POA: Diagnosis not present

## 2015-06-28 DIAGNOSIS — Z951 Presence of aortocoronary bypass graft: Secondary | ICD-10-CM | POA: Diagnosis not present

## 2015-06-28 DIAGNOSIS — G4733 Obstructive sleep apnea (adult) (pediatric): Secondary | ICD-10-CM

## 2015-06-28 DIAGNOSIS — Z9989 Dependence on other enabling machines and devices: Secondary | ICD-10-CM

## 2015-06-28 NOTE — Patient Instructions (Signed)
Your physician wants you to follow-up in: 1 Year. You will receive a reminder letter in the mail two months in advance. If you don't receive a letter, please call our office to schedule the follow-up appointment.  Your physician has recommended you make the following change in your medication: STOP Lipitor for 1 MONTH if leg weakness does not improved RESTART Lipitor, if it do improved call us back and you will be put on a difference Medication for your cholesterol

## 2015-06-28 NOTE — Progress Notes (Signed)
Patient ID: Timothy Gates, male   DOB: July 29, 1946, 69 y.o.   MRN: [id number]     Cardiology Office Note   Date:  06/30/2015   ID:  Timothy Gates, DOB 08-09-1946, MRN [id number]  PCP:  Dwan Bolt, MD  Cardiologist:   Sanda Klein, MD   Chief Complaint  Patient presents with  . Follow-up    chest pain on rt side      History of Present Illness: Timothy Gates is a 69 y.o. male who presents for follow-up for coronary artery disease status post CABG 16 years ago (Dr. Roxan Hockey, sequential LIMA to diagonal and LAD). Significant comorbidities include treated hyperlipidemia and obstructive sleep apnea.  He generally is feeling quite well but he has stopped his daily exercise. He does not have chest pain with activity but feels that his legs are weak and that he sweats a lot. He denies exertional dyspnea. He has not had dizziness, syncope or palpitation. His previous angina was a retrosternal burning sensation which has not returned since the bypass surgery. He had a normal nuclear perfusion study in July 2014 and has normal left ventricular systolic function.   Past Medical History  Diagnosis Date  . CAD (coronary artery disease)   . OSA (obstructive sleep apnea)   . Hyperlipidemia     Past Surgical History  Procedure Laterality Date  . Coronary artery bypass graft  04/02/1999  . Fissurectomy  2006     Current Outpatient Prescriptions  Medication Sig Dispense Refill  . acetaminophen (TYLENOL) 325 MG tablet Take 650 mg by mouth every 6 (six) hours as needed.    Marland Kitchen aspirin 81 MG chewable tablet Chew 81 mg by mouth daily.    Marland Kitchen atorvastatin (LIPITOR) 40 MG tablet Take 40 mg by mouth daily.     . cyanocobalamin 500 MCG tablet Take 500 mcg by mouth daily.    . finasteride (PROSCAR) 5 MG tablet Take 5 mg by mouth daily.    . meclizine (ANTIVERT) 25 MG tablet Take 25 mg by mouth 3 (three) times daily as needed for dizziness (prn).    . metoprolol succinate  (TOPROL-XL) 50 MG 24 hr tablet Take 25 mg by mouth daily. Take with or immediately following a meal.    . pantoprazole (PROTONIX) 40 MG tablet Take 40 mg by mouth daily.    . sertraline (ZOLOFT) 50 MG tablet Take 50 mg by mouth daily.    Marland Kitchen zolpidem (AMBIEN CR) 12.5 MG CR tablet Take 10 mg by mouth at bedtime as needed. For sleep     No current facility-administered medications for this visit.    Allergies:   Benadryl    Social History:  The patient  reports that he has quit smoking. His smoking use included Cigarettes. He quit smokeless tobacco use about 22 years ago. He reports that he does not drink alcohol or use illicit drugs.   Family History:  The patient's family history includes Cancer in his brother, father, maternal grandfather, maternal grandmother, sister, and sister; Diabetes in his mother; Heart attack in his paternal grandfather and paternal grandmother.    ROS:  Please see the history of present illness.    Otherwise, review of systems positive for none.   All other systems are reviewed and negative.    PHYSICAL EXAM: VS:  BP 110/60 mmHg  Pulse 69  Resp 16  Ht 5\' 6"  (1.676 m)  Wt 155 lb (70.308 kg)  BMI 25.03 kg/m2 , BMI Body mass index  is 25.03 kg/(m^2).  General: Alert, oriented x3, no distress Head: no evidence of trauma, PERRL, EOMI, no exophtalmos or lid lag, no myxedema, no xanthelasma; normal ears, nose and oropharynx Neck: normal jugular venous pulsations and no hepatojugular reflux; brisk carotid pulses without delay and no carotid bruits Chest: clear to auscultation, no signs of consolidation by percussion or palpation, normal fremitus, symmetrical and full respiratory excursions Cardiovascular: normal position and quality of the apical impulse, regular rhythm, normal first and  widely split second heart sounds, no  murmurs, rubs or gallops Abdomen: no tenderness or distention, no masses by palpation, no abnormal pulsatility or arterial bruits, normal bowel  sounds, no hepatosplenomegaly Extremities: no clubbing, cyanosis or edema; 2+ radial, ulnar and brachial pulses bilaterally; 2+ right femoral, posterior tibial and dorsalis pedis pulses; 2+ left femoral, posterior tibial and dorsalis pedis pulses; no subclavian or femoral bruits Neurological: grossly nonfocal Psych: euthymic mood, full affect   EKG:  EKG is ordered today. The ekg ordered today demonstratenormal sinus rhythm, old right bundle branch block, otherwise normal   Wt Readings from Last 3 Encounters:  06/28/15 155 lb (70.308 kg)  06/17/14 155 lb 11.2 oz (70.625 kg)  06/18/13 164 lb (74.39 kg)      Other studies Reviewed: Additional studies/ records that were reviewed today labs from April 2016 from Dr. Wilson Singer.  total cholesterol 119, triglycerides 151, HDL 33, LDL 56, normal LFTs and renal function tests   ASSESSMENT AND PLAN:  1. CAD s/p CABG - all arterial conduits; asymptomatic; normal nuclear study 2 years ago  2. Leg weakness  - no clear explanation , recommended a one-month statin holiday. If no improvement he should resume the atorvastatin which otherwise he has tolerated well for many years.  lipid parameters are generally good on the current regimen, HDL would improve if she could start exercising again. Consider evaluation of his lumbar spine. He has great lower extremity pulses and does not describe intermittent claudication.  3. Obstructive sleep apnea compliant with CPAP. He weighs about 10 pounds less than he did 2 years ago.     Current medicines are reviewed at length with the patient today.  The patient does not have concerns regarding medicines.   Labs/ tests ordered today include:  Orders Placed This Encounter  Procedures  . EKG 12-Lead    Patient Instructions  Your physician wants you to follow-up in: 1 Year. You will receive a reminder letter in the mail two months in advance. If you don't receive a letter, please call our office to schedule the  follow-up appointment.  Your physician has recommended you make the following change in your medication: STOP Lipitor for 1 MONTH if leg weakness does not improved RESTART Lipitor, if it do improved call us back and you will be put on a difference Medication for your cholesterol       Mikael Spray, MD  06/30/2015 9:21 AM    Sanda Klein, MD, [facility] [phone] office [phone] pager

## 2015-06-30 ENCOUNTER — Encounter: Payer: Self-pay | Admitting: Cardiovascular Disease

## 2015-07-28 ENCOUNTER — Telehealth: Payer: Self-pay | Admitting: Cardiovascular Disease

## 2015-07-28 DIAGNOSIS — E785 Hyperlipidemia, unspecified: Secondary | ICD-10-CM

## 2015-07-28 MED ORDER — PRAVASTATIN SODIUM 40 MG PO TABS: 40.0000 mg | ORAL_TABLET | Freq: Every evening | ORAL | Status: DC

## 2015-07-28 NOTE — Telephone Encounter (Signed)
Called pt to give instruction. Rx sent to preferred pharmacy, lab orders placed. Pt voiced understanding of instruction and recommendations.

## 2015-07-28 NOTE — Telephone Encounter (Signed)
Please prescribe pravastatin 40 mg daily to be taken in the evening and repeat lipid profile in 3 months

## 2015-07-28 NOTE — Telephone Encounter (Signed)
Spoke to patient. Took statin holiday - He had improvement w/ leg weakness he endorsed on 8/3 at office visit after stopping Atorvastatin. Called to see what medication we would like him on going forward.

## 2015-07-28 NOTE — Telephone Encounter (Signed)
Pt said Dr C took him off of Atorvastatin for a month,to see how he did not taking it. He said he did good when he was off of it. Does he want him to take something else or go back taking Atorvastatin?

## 2015-08-29 DIAGNOSIS — Z23 Encounter for immunization: Secondary | ICD-10-CM | POA: Diagnosis not present

## 2015-09-08 DIAGNOSIS — I10 Essential (primary) hypertension: Secondary | ICD-10-CM | POA: Diagnosis not present

## 2015-09-08 DIAGNOSIS — E789 Disorder of lipoprotein metabolism, unspecified: Secondary | ICD-10-CM | POA: Diagnosis not present

## 2015-09-15 DIAGNOSIS — K219 Gastro-esophageal reflux disease without esophagitis: Secondary | ICD-10-CM | POA: Diagnosis not present

## 2015-09-15 DIAGNOSIS — E7800 Pure hypercholesterolemia, unspecified: Secondary | ICD-10-CM | POA: Diagnosis not present

## 2015-09-15 DIAGNOSIS — F419 Anxiety disorder, unspecified: Secondary | ICD-10-CM | POA: Diagnosis not present

## 2015-09-15 DIAGNOSIS — I10 Essential (primary) hypertension: Secondary | ICD-10-CM | POA: Diagnosis not present

## 2015-09-26 ENCOUNTER — Encounter: Payer: Self-pay | Admitting: Cardiovascular Disease

## 2015-11-14 ENCOUNTER — Other Ambulatory Visit: Payer: Self-pay | Admitting: Cardiovascular Disease

## 2015-12-01 DIAGNOSIS — H25812 Combined forms of age-related cataract, left eye: Secondary | ICD-10-CM | POA: Diagnosis not present

## 2015-12-01 DIAGNOSIS — H25811 Combined forms of age-related cataract, right eye: Secondary | ICD-10-CM | POA: Diagnosis not present

## 2015-12-14 DIAGNOSIS — H2512 Age-related nuclear cataract, left eye: Secondary | ICD-10-CM | POA: Diagnosis not present

## 2015-12-14 DIAGNOSIS — H25812 Combined forms of age-related cataract, left eye: Secondary | ICD-10-CM | POA: Diagnosis not present

## 2016-01-25 DIAGNOSIS — H2511 Age-related nuclear cataract, right eye: Secondary | ICD-10-CM | POA: Diagnosis not present

## 2016-01-25 DIAGNOSIS — H25811 Combined forms of age-related cataract, right eye: Secondary | ICD-10-CM | POA: Diagnosis not present

## 2016-03-12 DIAGNOSIS — Z125 Encounter for screening for malignant neoplasm of prostate: Secondary | ICD-10-CM | POA: Diagnosis not present

## 2016-03-12 DIAGNOSIS — I10 Essential (primary) hypertension: Secondary | ICD-10-CM | POA: Diagnosis not present

## 2016-03-12 DIAGNOSIS — E291 Testicular hypofunction: Secondary | ICD-10-CM | POA: Diagnosis not present

## 2016-03-12 DIAGNOSIS — E7800 Pure hypercholesterolemia, unspecified: Secondary | ICD-10-CM | POA: Diagnosis not present

## 2016-03-15 DIAGNOSIS — Z0000 Encounter for general adult medical examination without abnormal findings: Secondary | ICD-10-CM | POA: Diagnosis not present

## 2016-03-18 DIAGNOSIS — G4700 Insomnia, unspecified: Secondary | ICD-10-CM | POA: Diagnosis not present

## 2016-03-18 DIAGNOSIS — K219 Gastro-esophageal reflux disease without esophagitis: Secondary | ICD-10-CM | POA: Diagnosis not present

## 2016-03-18 DIAGNOSIS — E7800 Pure hypercholesterolemia, unspecified: Secondary | ICD-10-CM | POA: Diagnosis not present

## 2016-03-18 DIAGNOSIS — F419 Anxiety disorder, unspecified: Secondary | ICD-10-CM | POA: Diagnosis not present

## 2016-03-22 DIAGNOSIS — L820 Inflamed seborrheic keratosis: Secondary | ICD-10-CM | POA: Diagnosis not present

## 2016-03-22 DIAGNOSIS — Z85828 Personal history of other malignant neoplasm of skin: Secondary | ICD-10-CM | POA: Diagnosis not present

## 2016-03-22 DIAGNOSIS — L570 Actinic keratosis: Secondary | ICD-10-CM | POA: Diagnosis not present

## 2016-03-22 DIAGNOSIS — D485 Neoplasm of uncertain behavior of skin: Secondary | ICD-10-CM | POA: Diagnosis not present

## 2016-04-04 DIAGNOSIS — Z1212 Encounter for screening for malignant neoplasm of rectum: Secondary | ICD-10-CM | POA: Diagnosis not present

## 2016-04-04 DIAGNOSIS — Z1211 Encounter for screening for malignant neoplasm of colon: Secondary | ICD-10-CM | POA: Diagnosis not present

## 2016-05-02 DIAGNOSIS — K219 Gastro-esophageal reflux disease without esophagitis: Secondary | ICD-10-CM | POA: Diagnosis not present

## 2016-05-02 DIAGNOSIS — Z1211 Encounter for screening for malignant neoplasm of colon: Secondary | ICD-10-CM | POA: Diagnosis not present

## 2016-06-12 DIAGNOSIS — Z1211 Encounter for screening for malignant neoplasm of colon: Secondary | ICD-10-CM | POA: Diagnosis not present

## 2016-06-13 ENCOUNTER — Telehealth: Payer: Self-pay

## 2016-06-13 ENCOUNTER — Encounter: Payer: Self-pay | Admitting: Cardiovascular Disease

## 2016-06-13 NOTE — Telephone Encounter (Signed)
Request for surgical clearance:   1. What type surgery is being performed? Colonoscopy  2. When is this surgery scheduled? 06/12/16  3. Are there any medications that need to be held prior to surgery and how long? n/a  4. Name of the physician performing surgery: Dr Collene Mares  5. What is the office phone and fax number?   Phone [phone]  Fax [phone]

## 2016-06-17 NOTE — Telephone Encounter (Signed)
Faxed clearance letter to Timothy Gates at [facility] center.

## 2016-07-18 ENCOUNTER — Ambulatory Visit (INDEPENDENT_AMBULATORY_CARE_PROVIDER_SITE_OTHER): Payer: Medicare Other | Admitting: Cardiovascular Disease

## 2016-07-18 ENCOUNTER — Encounter: Payer: Self-pay | Admitting: Cardiovascular Disease

## 2016-07-18 VITALS — BP 128/52 | HR 62 | Ht 66.0 in | Wt 151.0 lb

## 2016-07-18 DIAGNOSIS — I452 Bifascicular block: Secondary | ICD-10-CM | POA: Diagnosis not present

## 2016-07-18 DIAGNOSIS — G4733 Obstructive sleep apnea (adult) (pediatric): Secondary | ICD-10-CM | POA: Diagnosis not present

## 2016-07-18 DIAGNOSIS — I2510 Atherosclerotic heart disease of native coronary artery without angina pectoris: Secondary | ICD-10-CM | POA: Diagnosis not present

## 2016-07-18 DIAGNOSIS — Z9989 Dependence on other enabling machines and devices: Secondary | ICD-10-CM

## 2016-07-18 DIAGNOSIS — E785 Hyperlipidemia, unspecified: Secondary | ICD-10-CM | POA: Diagnosis not present

## 2016-07-18 NOTE — Patient Instructions (Signed)
Dr Croitoru recommends that you schedule a follow-up appointment in 12 months. You will receive a reminder letter in the mail two months in advance. If you don't receive a letter, please call our office to schedule the follow-up appointment.  If you need a refill on your cardiac medications before your next appointment, please call your pharmacy. 

## 2016-07-18 NOTE — Progress Notes (Signed)
Cardiology Office Note    Date:  07/20/2016   ID:  Timothy Gates, DOB 1946-11-03, MRN [id number]  PCP:  Dwan Bolt, MD  Cardiologist:  Cindie Crumbly, MD   Chief Complaint  Patient presents with  . Follow-up    pt c/o chest pain or indigestion    History of Present Illness:  Timothy Gates is a 70 y.o. male with history of coronary artery disease and bypass surgery with all arterial conduits (sequential LIMA to LAD, Dr. Roxan Hockey, 2000), obstructive sleep apnea and hyperlipidemia returning for routine follow-up. He has not had any angina despite a fairly active lifestyle. He walks frequently and has to play with his 90-year-old grandson. He has occasional chest discomfort that sounds quite distant from the "burning" that represented his pre-bypass angina. He denies exertional dyspnea. He has not had intermittent claudication, leg edema, palpitations, syncope or focal neurological complaints.    Past Medical History:  Diagnosis Date  . CAD (coronary artery disease)   . Hyperlipidemia   . OSA (obstructive sleep apnea)     Past Surgical History:  Procedure Laterality Date  . CORONARY ARTERY BYPASS GRAFT  04/02/1999  . FISSURECTOMY  2006    Current Medications: Outpatient Medications Prior to Visit  Medication Sig Dispense Refill  . acetaminophen (TYLENOL) 325 MG tablet Take 650 mg by mouth every 6 (six) hours as needed.    Marland Kitchen aspirin 81 MG chewable tablet Chew 81 mg by mouth daily.    . cyanocobalamin 500 MCG tablet Take 500 mcg by mouth daily.    . finasteride (PROSCAR) 5 MG tablet Take 5 mg by mouth daily.    . meclizine (ANTIVERT) 25 MG tablet Take 25 mg by mouth 3 (three) times daily as needed for dizziness (prn).    . metoprolol succinate (TOPROL-XL) 50 MG 24 hr tablet Take 25 mg by mouth daily. Take with or immediately following a meal.    . pantoprazole (PROTONIX) 40 MG tablet Take 40 mg by mouth daily.    . pravastatin (PRAVACHOL) 40 MG tablet TAKE  1 TABLET (40 MG TOTAL) BY MOUTH EVERY EVENING. 90 tablet 2  . sertraline (ZOLOFT) 50 MG tablet Take 50 mg by mouth daily.    Marland Kitchen zolpidem (AMBIEN CR) 12.5 MG CR tablet Take 10 mg by mouth at bedtime as needed. For sleep    . atorvastatin (LIPITOR) 40 MG tablet Take 40 mg by mouth daily.      No facility-administered medications prior to visit.      Allergies:   Benadryl [diphenhydramine hcl]   Social History   Social History  . Marital status: Married    Spouse name: N/A  . Number of children: N/A  . Years of education: N/A   Social History Main Topics  . Smoking status: Former Smoker    Types: Cigarettes  . Smokeless tobacco: Former Systems developer    Quit date: 11/24/1992  . Alcohol use No  . Drug use: No  . Sexual activity: Not Asked   Other Topics Concern  . None   Social History Narrative  . None     Family History:  The patient's family history includes Cancer in his brother, father, maternal grandfather, maternal grandmother, sister, and sister; Diabetes in his mother; Heart attack in his paternal grandfather and paternal grandmother.   ROS:   Please see the history of present illness.    ROS All other systems reviewed and are negative.   PHYSICAL EXAM:   VS:  BP (!) 128/52 (BP Location: Right Arm, Patient Position: Sitting, Cuff Size: Normal)   Pulse 62   Ht 5\' 6"  (1.676 m)   Wt 151 lb (68.5 kg)   SpO2 96%   BMI 24.37 kg/m    GEN: Well nourished, well developed, in no acute distress  HEENT: normal  Neck: no JVD, carotid bruits, or masses Cardiac: Widey split second heart sound RRR; no murmurs, rubs, or gallops,no edema  Respiratory:  clear to auscultation bilaterally, normal work of breathing GI: soft, nontender, nondistended, + BS MS: no deformity or atrophy  Skin: warm and dry, no rash Neuro:  Alert and Oriented x 3, Strength and sensation are intact Psych: euthymic mood, full affect  Wt Readings from Last 3 Encounters:  07/18/16 151 lb (68.5 kg)  06/28/15  155 lb (70.3 kg)  06/17/14 155 lb 11.2 oz (70.6 kg)      Studies/Labs Reviewed:   EKG:  EKG is ordered today.  The ekg ordered today demonstrates Sinus rhythm, right bundle branch block, left posterior fascicular block  Recent Labs: No results found for requested labs within last 8760 hours.   Lipid Panel No results found for: CHOL, TRIG, HDL, CHOLHDL, VLDL, LDLCALC, LDLDIRECT   ASSESSMENT:    1. Coronary artery disease involving native coronary artery of native heart without angina pectoris   2. Hyperlipidemia   3. OSA on CPAP   4. 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 CAD s/p CABG: Asymptomatic, free of angina 2. HLP: On statin to retrieve labs from PCP. 3. OSA: Reports compliance with CPAP. He is now at optimal weight.  4. RBBB+LPFB: Has been some progression in his conduction abnormalities. Discussed the fact that this may predict need for pacemaker in the future.   Medication Adjustments/Labs and Tests Ordered: Current medicines are reviewed at length with the patient today.  Concerns regarding medicines are outlined above.  Medication changes, Labs and Tests ordered today are listed in the Patient Instructions below. Patient Instructions  Dr Sallyanne Kuster recommends that you schedule a follow-up appointment in 12 months. You will receive a reminder letter in the mail two months in advance. If you don't receive a letter, please call our office to schedule the follow-up appointment.  If you need a refill on your cardiac medications before your next appointment, please call your pharmacy.    Signed, Sanda Klein, MD  07/20/2016 8:45 AM    [facility] [place], [place], [place]  [address] Phone: [phone]; Fax: [phone]

## 2016-07-19 ENCOUNTER — Telehealth: Payer: Self-pay | Admitting: Cardiovascular Disease

## 2016-07-19 NOTE — Telephone Encounter (Signed)
Returned call to patient. Notified him that MCr is still dictating his note - patient verbalized understanding. Patient states that during his office visit Dr C discussed with him his electrolytes and EKG and mentioned that patient could possibly need a pacemaker in the future. Patient went home, thought about it and didn't quite understand. He stated he wasn't completely paying attention. Patient states he wants to be sure of what was discussed at office visit. Please advise?

## 2016-07-19 NOTE — Telephone Encounter (Signed)
Returned call to patient-pt has multiple questions regarding yesterday visit with MD Croitoru.  OV note imcomplete, will route to primary for f/u.  Pt aware and verbalized understanding.

## 2016-07-19 NOTE — Telephone Encounter (Signed)
New message    Patient calling needing a little more clarification from yesterday visit.

## 2016-07-20 NOTE — Telephone Encounter (Signed)
Reviewing his EKG where he has some conduction abnormalities (right bundle branch block and left posterior fascicular block). This might mean that he could need a pacemaker in the future but I could be years and years away

## 2016-07-23 ENCOUNTER — Other Ambulatory Visit: Payer: Self-pay

## 2016-07-23 NOTE — Telephone Encounter (Signed)
lmtcb

## 2016-07-24 NOTE — Telephone Encounter (Signed)
F/u Message ° °Pt returning RN call. Please call back to discuss  °

## 2016-07-31 NOTE — Telephone Encounter (Signed)
Returned call to patient. Reviewed information with patient. Patient verbalized understanding and voiced appreciation for phone call.

## 2016-08-02 ENCOUNTER — Telehealth: Payer: Self-pay | Admitting: Cardiovascular Disease

## 2016-08-02 DIAGNOSIS — I208 Other forms of angina pectoris: Secondary | ICD-10-CM

## 2016-08-02 NOTE — Telephone Encounter (Signed)
Please schedule for a treadmill  Myoview for exertional angina. As long as symptoms are predictably brought on by physical activity and promptly relieved by rest, OK to do workup as outpatient. If symptoms occur at rest or last > 20 minutes, he needs ED evaluation, please.

## 2016-08-02 NOTE — Telephone Encounter (Signed)
Returned call to patient. Relayed instructions and recommendations in detail. I have made him aware we will have scheduler reach out to arrange test date for him. Patient verbalized understanding and thanks.

## 2016-08-02 NOTE — Telephone Encounter (Signed)
New Message  Pt call requesting to speak with Rn. Pt states he is having the burning sensation in her chest. Pt states he was instructed to call and inform provider if this feeling ever returned. Pt states he was mowing grass when the feeling came back. Pt states it was a coming and going burning in the chest. Please call back to discuss

## 2016-08-02 NOTE — Telephone Encounter (Signed)
Pt calling to report that yesterday he had symptom of burning discomfort in throat/upper chest while mowing - notes not severe or long lasting; sat down and rested and felt better. Notes he had this symptom in 2000, which let to positive stress test findings, confirmed blockage/led to his bypass surgery. He didn't need to take anything to relieve it.  No SOB w event, no dizziness, lightheadedness, arm or neck discomfort.  Notes "a little bit tired" all the time (not related to discomfort). No symptoms today.   Was instructed by Dr. Sallyanne Kuster to report symptoms if they happen.  Pt aware to go to ER if pain returns and doesn't resolve. Aware I will route to provider to advise.

## 2016-08-07 ENCOUNTER — Telehealth (HOSPITAL_COMMUNITY): Payer: Self-pay

## 2016-08-07 NOTE — Telephone Encounter (Signed)
Encounter complete. 

## 2016-08-09 ENCOUNTER — Ambulatory Visit (HOSPITAL_COMMUNITY)
Admission: RE | Admit: 2016-08-09 | Discharge: 2016-08-09 | Disposition: A | Discharge status: Home / Self Care | Payer: Medicare Other | Source: Ambulatory Visit | Attending: Cardiology | Admitting: Cardiology

## 2016-08-09 DIAGNOSIS — I208 Other forms of angina pectoris: Secondary | ICD-10-CM | POA: Diagnosis not present

## 2016-08-09 LAB — MYOCARDIAL PERFUSION IMAGING
CHL CUP NUCLEAR SDS: 0
CHL CUP RESTING HR STRESS: 57 {beats}/min
CSEPHR: 97 %
Estimated workload: 7 METS
Exercise duration (min): 6 min
Exercise duration (sec): 19 s
LV sys vol: 32 mL
LVDIAVOL: 75 mL (ref 62–150)
MPHR: 150 {beats}/min
Peak HR: 146 {beats}/min
RPE: 17
SRS: 3
SSS: 3
TID: 1

## 2016-08-09 MED ORDER — TECHNETIUM TC 99M TETROFOSMIN IV KIT
10.9000 | PACK | Freq: Once | INTRAVENOUS | Status: AC | PRN
  Administered 2016-08-09: 10.9 via INTRAVENOUS
  Filled 2016-08-09: qty 11

## 2016-08-09 MED ORDER — TECHNETIUM TC 99M TETROFOSMIN IV KIT
31.4000 | PACK | Freq: Once | INTRAVENOUS | Status: AC | PRN
  Administered 2016-08-09: 31 via INTRAVENOUS
  Filled 2016-08-09: qty 31

## 2016-09-10 DIAGNOSIS — E7800 Pure hypercholesterolemia, unspecified: Secondary | ICD-10-CM | POA: Diagnosis not present

## 2016-09-16 ENCOUNTER — Other Ambulatory Visit: Payer: Self-pay

## 2016-09-16 MED ORDER — PRAVASTATIN SODIUM 40 MG PO TABS: ORAL_TABLET | ORAL | 6 refills | Status: DC

## 2016-09-17 DIAGNOSIS — F419 Anxiety disorder, unspecified: Secondary | ICD-10-CM | POA: Diagnosis not present

## 2016-09-17 DIAGNOSIS — E785 Hyperlipidemia, unspecified: Secondary | ICD-10-CM | POA: Diagnosis not present

## 2016-09-17 DIAGNOSIS — G4700 Insomnia, unspecified: Secondary | ICD-10-CM | POA: Diagnosis not present

## 2016-09-17 DIAGNOSIS — Z23 Encounter for immunization: Secondary | ICD-10-CM | POA: Diagnosis not present

## 2017-03-20 DIAGNOSIS — Z125 Encounter for screening for malignant neoplasm of prostate: Secondary | ICD-10-CM | POA: Diagnosis not present

## 2017-03-20 DIAGNOSIS — I10 Essential (primary) hypertension: Secondary | ICD-10-CM | POA: Diagnosis not present

## 2017-03-20 DIAGNOSIS — E291 Testicular hypofunction: Secondary | ICD-10-CM | POA: Diagnosis not present

## 2017-03-20 DIAGNOSIS — N400 Enlarged prostate without lower urinary tract symptoms: Secondary | ICD-10-CM | POA: Diagnosis not present

## 2017-03-27 DIAGNOSIS — E785 Hyperlipidemia, unspecified: Secondary | ICD-10-CM | POA: Diagnosis not present

## 2017-03-27 DIAGNOSIS — F419 Anxiety disorder, unspecified: Secondary | ICD-10-CM | POA: Diagnosis not present

## 2017-03-27 DIAGNOSIS — I2510 Atherosclerotic heart disease of native coronary artery without angina pectoris: Secondary | ICD-10-CM | POA: Diagnosis not present

## 2017-03-27 DIAGNOSIS — G4700 Insomnia, unspecified: Secondary | ICD-10-CM | POA: Diagnosis not present

## 2017-07-23 DIAGNOSIS — Z85828 Personal history of other malignant neoplasm of skin: Secondary | ICD-10-CM | POA: Diagnosis not present

## 2017-07-23 DIAGNOSIS — L821 Other seborrheic keratosis: Secondary | ICD-10-CM | POA: Diagnosis not present

## 2017-07-23 DIAGNOSIS — L820 Inflamed seborrheic keratosis: Secondary | ICD-10-CM | POA: Diagnosis not present

## 2017-07-23 DIAGNOSIS — D485 Neoplasm of uncertain behavior of skin: Secondary | ICD-10-CM | POA: Diagnosis not present

## 2017-07-23 DIAGNOSIS — D224 Melanocytic nevi of scalp and neck: Secondary | ICD-10-CM | POA: Diagnosis not present

## 2017-07-23 DIAGNOSIS — L570 Actinic keratosis: Secondary | ICD-10-CM | POA: Diagnosis not present

## 2017-07-23 DIAGNOSIS — L814 Other melanin hyperpigmentation: Secondary | ICD-10-CM | POA: Diagnosis not present

## 2017-07-25 DIAGNOSIS — M545 Low back pain: Secondary | ICD-10-CM | POA: Diagnosis not present

## 2017-08-12 ENCOUNTER — Ambulatory Visit (INDEPENDENT_AMBULATORY_CARE_PROVIDER_SITE_OTHER): Payer: Self-pay | Admitting: Orthopaedic Surgery

## 2017-08-21 DIAGNOSIS — R21 Rash and other nonspecific skin eruption: Secondary | ICD-10-CM | POA: Diagnosis not present

## 2017-08-21 DIAGNOSIS — L290 Pruritus ani: Secondary | ICD-10-CM | POA: Diagnosis not present

## 2017-08-21 DIAGNOSIS — K219 Gastro-esophageal reflux disease without esophagitis: Secondary | ICD-10-CM | POA: Diagnosis not present

## 2017-08-29 DIAGNOSIS — Z23 Encounter for immunization: Secondary | ICD-10-CM | POA: Diagnosis not present

## 2017-09-11 DIAGNOSIS — N5201 Erectile dysfunction due to arterial insufficiency: Secondary | ICD-10-CM | POA: Diagnosis not present

## 2017-09-30 ENCOUNTER — Ambulatory Visit: Payer: Medicare Other | Admitting: Cardiovascular Disease

## 2017-10-01 DIAGNOSIS — I2510 Atherosclerotic heart disease of native coronary artery without angina pectoris: Secondary | ICD-10-CM | POA: Diagnosis not present

## 2017-10-01 DIAGNOSIS — G4700 Insomnia, unspecified: Secondary | ICD-10-CM | POA: Diagnosis not present

## 2017-10-01 DIAGNOSIS — E785 Hyperlipidemia, unspecified: Secondary | ICD-10-CM | POA: Diagnosis not present

## 2017-10-01 DIAGNOSIS — F419 Anxiety disorder, unspecified: Secondary | ICD-10-CM | POA: Diagnosis not present

## 2017-10-01 DIAGNOSIS — K219 Gastro-esophageal reflux disease without esophagitis: Secondary | ICD-10-CM | POA: Diagnosis not present

## 2017-10-01 DIAGNOSIS — N400 Enlarged prostate without lower urinary tract symptoms: Secondary | ICD-10-CM | POA: Diagnosis not present

## 2017-10-14 ENCOUNTER — Ambulatory Visit: Payer: Medicare Other | Admitting: Cardiovascular Disease

## 2018-03-26 DIAGNOSIS — Z125 Encounter for screening for malignant neoplasm of prostate: Secondary | ICD-10-CM | POA: Diagnosis not present

## 2018-03-26 DIAGNOSIS — I10 Essential (primary) hypertension: Secondary | ICD-10-CM | POA: Diagnosis not present

## 2018-03-26 DIAGNOSIS — E7800 Pure hypercholesterolemia, unspecified: Secondary | ICD-10-CM | POA: Diagnosis not present

## 2018-03-26 DIAGNOSIS — D638 Anemia in other chronic diseases classified elsewhere: Secondary | ICD-10-CM | POA: Diagnosis not present

## 2018-04-01 DIAGNOSIS — D649 Anemia, unspecified: Secondary | ICD-10-CM | POA: Diagnosis not present

## 2018-04-01 DIAGNOSIS — E7800 Pure hypercholesterolemia, unspecified: Secondary | ICD-10-CM | POA: Diagnosis not present

## 2018-04-01 DIAGNOSIS — Z0000 Encounter for general adult medical examination without abnormal findings: Secondary | ICD-10-CM | POA: Diagnosis not present

## 2018-04-01 DIAGNOSIS — K219 Gastro-esophageal reflux disease without esophagitis: Secondary | ICD-10-CM | POA: Diagnosis not present

## 2018-04-01 DIAGNOSIS — I2510 Atherosclerotic heart disease of native coronary artery without angina pectoris: Secondary | ICD-10-CM | POA: Diagnosis not present

## 2018-04-01 DIAGNOSIS — G4700 Insomnia, unspecified: Secondary | ICD-10-CM | POA: Diagnosis not present

## 2018-04-01 DIAGNOSIS — F419 Anxiety disorder, unspecified: Secondary | ICD-10-CM | POA: Diagnosis not present

## 2018-04-03 DIAGNOSIS — Z1212 Encounter for screening for malignant neoplasm of rectum: Secondary | ICD-10-CM | POA: Diagnosis not present

## 2018-09-02 DIAGNOSIS — Z23 Encounter for immunization: Secondary | ICD-10-CM | POA: Diagnosis not present

## 2018-09-28 ENCOUNTER — Ambulatory Visit (INDEPENDENT_AMBULATORY_CARE_PROVIDER_SITE_OTHER): Payer: Medicare Other | Admitting: Cardiovascular Disease

## 2018-09-28 ENCOUNTER — Encounter: Payer: Self-pay | Admitting: Cardiovascular Disease

## 2018-09-28 VITALS — BP 132/68 | HR 67 | Ht 66.0 in | Wt 158.8 lb

## 2018-09-28 DIAGNOSIS — G4733 Obstructive sleep apnea (adult) (pediatric): Secondary | ICD-10-CM | POA: Diagnosis not present

## 2018-09-28 DIAGNOSIS — I2510 Atherosclerotic heart disease of native coronary artery without angina pectoris: Secondary | ICD-10-CM | POA: Diagnosis not present

## 2018-09-28 DIAGNOSIS — E785 Hyperlipidemia, unspecified: Secondary | ICD-10-CM | POA: Diagnosis not present

## 2018-09-28 DIAGNOSIS — I4510 Unspecified right bundle-branch block: Secondary | ICD-10-CM

## 2018-09-28 NOTE — Progress Notes (Signed)
.    Cardiology Office Note    Date:  09/28/2018   ID:  Timothy Gates, DOB 16-Feb-1946, MRN [id number]  PCP:  Anda Kraft, MD  Cardiologist:  Cindie Crumbly, MD   Chief Complaint  Patient presents with  . Follow-up    CAD    History of Present Illness:  Timothy Gates is a 72 y.o. male with history of coronary artery disease and bypass surgery with all arterial conduits (sequential LIMA to LAD, Dr. Roxan Hockey, 2000), obstructive sleep apnea and hyperlipidemia returning for routine follow-up.  The patient specifically denies any chest pain at rest exertion, dyspnea at rest or with exertion, orthopnea, paroxysmal nocturnal dyspnea, syncope, palpitations, focal neurological deficits, intermittent claudication, lower extremity edema, unexplained weight gain, cough, hemoptysis or wheezing.  His limitations are related to low back pain and weak legs.  If he crunches down he has a hard time getting back up without support.  He does not have intermittent claudication.  He is planning to see a back specialist.  After losing weight no longer has any symptoms of daytime hypersomnolence at night.  He no longer requires CPAP.  His most recent profile showed an LDL that was above target at 86, early in the year his LDL was better.  He has repeat lab scheduled with his primary care physician next week.   Past Medical History:  Diagnosis Date  . CAD (coronary artery disease)   . Hyperlipidemia   . OSA (obstructive sleep apnea)     Past Surgical History:  Procedure Laterality Date  . CORONARY ARTERY BYPASS GRAFT  04/02/1999  . FISSURECTOMY  2006    Current Medications: Outpatient Medications Prior to Visit  Medication Sig Dispense Refill  . aspirin 81 MG chewable tablet Chew 81 mg by mouth daily.    . Cholecalciferol (VITAMIN D3) 1000 units CAPS Take 1,000 Units by mouth daily.    . cyanocobalamin 500 MCG tablet Take 500 mcg by mouth daily.    . finasteride (PROSCAR) 5 MG  tablet Take 5 mg by mouth daily.    . meclizine (ANTIVERT) 25 MG tablet Take 25 mg by mouth 3 (three) times daily as needed for dizziness (prn).    . metoprolol succinate (TOPROL-XL) 50 MG 24 hr tablet Take 25 mg by mouth daily. Take with or immediately following a meal.    . pantoprazole (PROTONIX) 40 MG tablet Take 40 mg by mouth daily.    . pravastatin (PRAVACHOL) 40 MG tablet TAKE 1 TABLET (40 MG TOTAL) BY MOUTH EVERY EVENING. 30 tablet 6  . sertraline (ZOLOFT) 50 MG tablet Take 50 mg by mouth daily.    Marland Kitchen zolpidem (AMBIEN CR) 12.5 MG CR tablet Take 10 mg by mouth at bedtime as needed. For sleep    . acetaminophen (TYLENOL) 325 MG tablet Take 650 mg by mouth every 6 (six) hours as needed.     No facility-administered medications prior to visit.      Allergies:   Benadryl [diphenhydramine hcl]   Social History   Socioeconomic History  . Marital status: Married    Spouse name: Not on file  . Number of children: Not on file  . Years of education: Not on file  . Highest education level: Not on file  Occupational History  . Not on file  Social Needs  . Financial resource strain: Not on file  . Food insecurity:    Worry: Not on file    Inability: Not on file  .  Transportation needs:    Medical: Not on file    Non-medical: Not on file  Tobacco Use  . Smoking status: Former Smoker    Types: Cigarettes  . Smokeless tobacco: Former Systems developer    Quit date: 11/24/1992  Substance and Sexual Activity  . Alcohol use: No  . Drug use: No  . Sexual activity: Not on file  Lifestyle  . Physical activity:    Days per week: Not on file    Minutes per session: Not on file  . Stress: Not on file  Relationships  . Social connections:    Talks on phone: Not on file    Gets together: Not on file    Attends religious service: Not on file    Active member of club or organization: Not on file    Attends meetings of clubs or organizations: Not on file    Relationship status: Not on file  Other  Topics Concern  . Not on file  Social History Narrative  . Not on file     Family History:  The patient's family history includes Cancer in his brother, father, maternal grandfather, maternal grandmother, sister, and sister; Diabetes in his mother; Heart attack in his paternal grandfather and paternal grandmother.   ROS:   Please see the history of present illness.    ROS all other systems are reviewed and are negative   PHYSICAL EXAM:   VS:  BP 132/68   Pulse 67   Ht 5\' 6"  (1.676 m)   Wt 158 lb 12.8 oz (72 kg)   BMI 25.63 kg/m     General: Alert, oriented x3, no distress, appears lean and fit Head: no evidence of trauma, PERRL, EOMI, no exophtalmos or lid lag, no myxedema, no xanthelasma; normal ears, nose and oropharynx Neck: normal jugular venous pulsations and no hepatojugular reflux; brisk carotid pulses without delay and no carotid bruits Chest: clear to auscultation, no signs of consolidation by percussion or palpation, normal fremitus, symmetrical and full respiratory excursions Cardiovascular: normal position and quality of the apical impulse, regular rhythm, normal first and second heart sounds, no murmurs, rubs or gallops Abdomen: no tenderness or distention, no masses by palpation, no abnormal pulsatility or arterial bruits, normal bowel sounds, no hepatosplenomegaly Extremities: no clubbing, cyanosis or edema; 2+ radial, ulnar and brachial pulses bilaterally; 2+ right femoral, posterior tibial and dorsalis pedis pulses; 2+ left femoral, posterior tibial and dorsalis pedis pulses; no subclavian or femoral bruits Neurological: grossly nonfocal Psych: Normal mood and affect   Wt Readings from Last 3 Encounters:  09/28/18 158 lb 12.8 oz (72 kg)  08/09/16 151 lb (68.5 kg)  07/18/16 151 lb (68.5 kg)      Studies/Labs Reviewed:   EKG:  EKG is ordered today.  The ekg ordered today demonstrates sinus rhythm, right bundle branch block, rightward axis (previous ECG also  showed posterior fascicular block).  QTc 454 ms, no acute repolarization abnormalities  Recent Labs: The second 2019 labs hemoglobin 11.9 with normocytic indices, creatinine 0.8, normal electrolytes and liver function tests,  Lipid Panel Total cholesterol 144, triglycerides 161, HDL 26, LDL 86 (6 months earlier LDL 76).   ASSESSMENT:    1. Coronary artery disease involving native coronary artery of native heart without angina pectoris   2. Dyslipidemia (high LDL; low HDL)   3. OSA (obstructive sleep apnea)   4. RBBB      PLAN:  In order of problems listed above:  1. CAD s/p CABG: No angina  despite active lifestyle. 2. HLP: On statin.  He has chronically low HDL cholesterol, but his LDL is close to acceptable level.  He is compliant with statin.  Previous statin holiday did not lead to any improvement in his leg symptoms.  Sounds more like a problem with his lumbar spine. 3. OSA: No longer using CPAP.  Denies daytime hypersomnolence or other symptoms of sleep apnea.Marland Kitchen He is now at optimal weight.  4. RBBB+LPFB:  Today the left posterior fascicular block is not seen.  No symptoms to suggest high-grade AV block.   Medication Adjustments/Labs and Tests Ordered: Current medicines are reviewed at length with the patient today.  Concerns regarding medicines are outlined above.  Medication changes, Labs and Tests ordered today are listed in the Patient Instructions below. Patient Instructions  Medication Instructions:  Dr Sallyanne Kuster recommends that you continue on your current medications as directed. Please refer to the Current Medication list given to you today.  If you need a refill on your cardiac medications before your next appointment, please call your pharmacy.   Follow-Up: At [facility], you and your health needs are our priority.  As part of our continuing mission to provide you with exceptional heart care, we have created designated Provider Care Teams.  These Care Teams  include your primary Cardiologist (physician) and Advanced Practice Providers (APPs -  Physician Assistants and Nurse Practitioners) who all work together to provide you with the care you need, when you need it. You will need a follow up appointment in 12 months.  Please call our office 2 months in advance to schedule this appointment.  You may see Sanda Klein, MD or one of the following Advanced Practice Providers on your designated Care Team: [place], [place] . Fabian Sharp, PA-C    Signed, Sanda Klein, MD  09/28/2018 8:50 AM    [facility] [place], [place], [place]  [address] Phone: [phone]; Fax: [phone]

## 2018-09-28 NOTE — Patient Instructions (Addendum)
Medication Instructions:  Dr Croitoru recommends that you continue on your current medications as directed. Please refer to the Current Medication list given to you today.  If you need a refill on your cardiac medications before your next appointment, please call your pharmacy.   Follow-Up: At [facility], you and your health needs are our priority.  As part of our continuing mission to provide you with exceptional heart care, we have created designated Provider Care Teams.  These Care Teams include your primary Cardiologist (physician) and Advanced Practice Providers (APPs -  Physician Assistants and Nurse Practitioners) who all work together to provide you with the care you need, when you need it. You will need a follow up appointment in 12 months.  Please call our office 2 months in advance to schedule this appointment.  You may see Mihai Croitoru, MD or one of the following Advanced Practice Providers on your designated Care Team: Hao Meng, PA-C . Jerid Catherman Duke, PA-C 

## 2018-09-29 DIAGNOSIS — D649 Anemia, unspecified: Secondary | ICD-10-CM | POA: Diagnosis not present

## 2018-09-29 DIAGNOSIS — E7800 Pure hypercholesterolemia, unspecified: Secondary | ICD-10-CM | POA: Diagnosis not present

## 2018-10-07 DIAGNOSIS — I2510 Atherosclerotic heart disease of native coronary artery without angina pectoris: Secondary | ICD-10-CM | POA: Diagnosis not present

## 2018-10-07 DIAGNOSIS — K219 Gastro-esophageal reflux disease without esophagitis: Secondary | ICD-10-CM | POA: Diagnosis not present

## 2018-10-07 DIAGNOSIS — E785 Hyperlipidemia, unspecified: Secondary | ICD-10-CM | POA: Diagnosis not present

## 2018-10-07 DIAGNOSIS — F419 Anxiety disorder, unspecified: Secondary | ICD-10-CM | POA: Diagnosis not present

## 2018-10-07 DIAGNOSIS — G4700 Insomnia, unspecified: Secondary | ICD-10-CM | POA: Diagnosis not present

## 2018-12-21 DIAGNOSIS — G43101 Migraine with aura, not intractable, with status migrainosus: Secondary | ICD-10-CM | POA: Diagnosis not present

## 2018-12-21 DIAGNOSIS — E7800 Pure hypercholesterolemia, unspecified: Secondary | ICD-10-CM | POA: Diagnosis not present

## 2018-12-21 DIAGNOSIS — I2510 Atherosclerotic heart disease of native coronary artery without angina pectoris: Secondary | ICD-10-CM | POA: Diagnosis not present

## 2018-12-21 DIAGNOSIS — H539 Unspecified visual disturbance: Secondary | ICD-10-CM | POA: Diagnosis not present

## 2018-12-22 ENCOUNTER — Other Ambulatory Visit: Payer: Self-pay | Admitting: Internal Medicine

## 2018-12-22 DIAGNOSIS — H539 Unspecified visual disturbance: Secondary | ICD-10-CM

## 2018-12-24 DIAGNOSIS — I2510 Atherosclerotic heart disease of native coronary artery without angina pectoris: Secondary | ICD-10-CM | POA: Diagnosis not present

## 2018-12-24 DIAGNOSIS — G43101 Migraine with aura, not intractable, with status migrainosus: Secondary | ICD-10-CM | POA: Diagnosis not present

## 2018-12-24 DIAGNOSIS — G43909 Migraine, unspecified, not intractable, without status migrainosus: Secondary | ICD-10-CM | POA: Diagnosis not present

## 2018-12-24 DIAGNOSIS — H539 Unspecified visual disturbance: Secondary | ICD-10-CM | POA: Diagnosis not present

## 2018-12-31 ENCOUNTER — Other Ambulatory Visit: Payer: Medicare Other

## 2019-01-04 ENCOUNTER — Ambulatory Visit
Admission: RE | Admit: 2019-01-04 | Discharge: 2019-01-04 | Disposition: A | Discharge status: Home / Self Care | Payer: Medicare Other | Source: Ambulatory Visit | Attending: Internal Medicine | Admitting: Internal Medicine

## 2019-01-04 DIAGNOSIS — H539 Unspecified visual disturbance: Secondary | ICD-10-CM

## 2019-01-04 DIAGNOSIS — H538 Other visual disturbances: Secondary | ICD-10-CM | POA: Diagnosis not present

## 2019-01-04 DIAGNOSIS — R51 Headache: Secondary | ICD-10-CM | POA: Diagnosis not present

## 2019-01-13 ENCOUNTER — Encounter: Payer: Self-pay | Admitting: Neurology

## 2019-02-08 ENCOUNTER — Ambulatory Visit: Payer: Medicare Other | Admitting: Neurology

## 2019-03-30 DIAGNOSIS — E7800 Pure hypercholesterolemia, unspecified: Secondary | ICD-10-CM | POA: Diagnosis not present

## 2019-03-30 DIAGNOSIS — I10 Essential (primary) hypertension: Secondary | ICD-10-CM | POA: Diagnosis not present

## 2019-04-06 DIAGNOSIS — I2510 Atherosclerotic heart disease of native coronary artery without angina pectoris: Secondary | ICD-10-CM | POA: Diagnosis not present

## 2019-04-06 DIAGNOSIS — G4700 Insomnia, unspecified: Secondary | ICD-10-CM | POA: Diagnosis not present

## 2019-04-06 DIAGNOSIS — K219 Gastro-esophageal reflux disease without esophagitis: Secondary | ICD-10-CM | POA: Diagnosis not present

## 2019-04-06 DIAGNOSIS — E7800 Pure hypercholesterolemia, unspecified: Secondary | ICD-10-CM | POA: Diagnosis not present

## 2019-04-06 DIAGNOSIS — F419 Anxiety disorder, unspecified: Secondary | ICD-10-CM | POA: Diagnosis not present

## 2019-04-06 DIAGNOSIS — E538 Deficiency of other specified B group vitamins: Secondary | ICD-10-CM | POA: Diagnosis not present

## 2019-04-06 DIAGNOSIS — Z0000 Encounter for general adult medical examination without abnormal findings: Secondary | ICD-10-CM | POA: Diagnosis not present

## 2019-04-06 DIAGNOSIS — E611 Iron deficiency: Secondary | ICD-10-CM | POA: Diagnosis not present

## 2019-04-06 DIAGNOSIS — I10 Essential (primary) hypertension: Secondary | ICD-10-CM | POA: Diagnosis not present

## 2019-06-14 DIAGNOSIS — M25512 Pain in left shoulder: Secondary | ICD-10-CM | POA: Diagnosis not present

## 2019-06-14 DIAGNOSIS — M25511 Pain in right shoulder: Secondary | ICD-10-CM | POA: Diagnosis not present

## 2019-06-24 ENCOUNTER — Other Ambulatory Visit: Payer: Self-pay

## 2019-08-05 DIAGNOSIS — Z23 Encounter for immunization: Secondary | ICD-10-CM | POA: Diagnosis not present

## 2019-09-29 ENCOUNTER — Other Ambulatory Visit: Payer: Self-pay

## 2019-09-29 DIAGNOSIS — Z20822 Contact with and (suspected) exposure to covid-19: Secondary | ICD-10-CM

## 2019-09-30 LAB — NOVEL CORONAVIRUS, NAA: SARS-CoV-2, NAA: NOT DETECTED

## 2019-10-04 DIAGNOSIS — E611 Iron deficiency: Secondary | ICD-10-CM | POA: Diagnosis not present

## 2019-10-04 DIAGNOSIS — E7800 Pure hypercholesterolemia, unspecified: Secondary | ICD-10-CM | POA: Diagnosis not present

## 2019-10-04 DIAGNOSIS — E538 Deficiency of other specified B group vitamins: Secondary | ICD-10-CM | POA: Diagnosis not present

## 2019-10-12 DIAGNOSIS — F5101 Primary insomnia: Secondary | ICD-10-CM | POA: Diagnosis not present

## 2019-10-12 DIAGNOSIS — I2510 Atherosclerotic heart disease of native coronary artery without angina pectoris: Secondary | ICD-10-CM | POA: Diagnosis not present

## 2019-10-12 DIAGNOSIS — I10 Essential (primary) hypertension: Secondary | ICD-10-CM | POA: Diagnosis not present

## 2019-10-12 DIAGNOSIS — E785 Hyperlipidemia, unspecified: Secondary | ICD-10-CM | POA: Diagnosis not present

## 2019-10-12 DIAGNOSIS — R1084 Generalized abdominal pain: Secondary | ICD-10-CM | POA: Diagnosis not present

## 2019-10-12 DIAGNOSIS — K5900 Constipation, unspecified: Secondary | ICD-10-CM | POA: Diagnosis not present

## 2019-10-12 DIAGNOSIS — K219 Gastro-esophageal reflux disease without esophagitis: Secondary | ICD-10-CM | POA: Diagnosis not present

## 2019-10-28 ENCOUNTER — Encounter: Payer: Self-pay | Admitting: Cardiovascular Disease

## 2019-10-28 ENCOUNTER — Other Ambulatory Visit: Payer: Self-pay

## 2019-10-28 ENCOUNTER — Ambulatory Visit (INDEPENDENT_AMBULATORY_CARE_PROVIDER_SITE_OTHER): Payer: Medicare Other | Admitting: Cardiovascular Disease

## 2019-10-28 VITALS — BP 132/78 | HR 63 | Temp 96.9°F | Ht 67.0 in | Wt 160.4 lb

## 2019-10-28 DIAGNOSIS — I25118 Atherosclerotic heart disease of native coronary artery with other forms of angina pectoris: Secondary | ICD-10-CM

## 2019-10-28 DIAGNOSIS — E782 Mixed hyperlipidemia: Secondary | ICD-10-CM

## 2019-10-28 DIAGNOSIS — I4510 Unspecified right bundle-branch block: Secondary | ICD-10-CM

## 2019-10-28 HISTORY — DX: Unspecified right bundle-branch block: I45.10

## 2019-10-28 NOTE — Patient Instructions (Signed)

## 2019-10-28 NOTE — Progress Notes (Signed)
.    Cardiology Office Note    Date:  10/28/2019   ID:  CHANCE DIPACE, DOB 08-16-1946, MRN [id number]  PCP:  Deland Pretty, MD  Cardiologist:  Cindie Crumbly, MD   No chief complaint on file.   History of Present Illness:  DREVAN ASBILL is a 73 y.o. male with history of coronary artery disease and bypass surgery with all arterial conduits (sequential LIMA to LAD, Dr. Roxan Hockey, 2000), obstructive sleep apnea and hyperlipidemia returning for routine follow-up.  When he works particularly hard (chopping or carrying wood) he notices a slight discomfort in his upper left chest.  He demonstrates this finding with 1 finger.  It resolves within a few minutes if he rests.  He does not happen during usual daily activity.  It is different from his previous angina which she describes as a retrosternal burning sensation.  The patient specifically denies any chest pain at rest, dyspnea at rest or with exertion, orthopnea, paroxysmal nocturnal dyspnea, syncope, palpitations, focal neurological deficits, intermittent claudication, lower extremity edema, unexplained weight gain, cough, hemoptysis or wheezing.  He denies problems with daytime hypersomnolence.  He is minimally overweight and no longer uses CPAP.  His most recent lipid profile showed an LDL of 92, but in May his LDL was only 71.  As always he has a rather low HDL and mild hypertriglyceridemia.  He does not have full-blown diabetes but has borderline glucose levels.  Past Medical History:  Diagnosis Date  . CAD (coronary artery disease)   . Hyperlipidemia   . OSA (obstructive sleep apnea)     Past Surgical History:  Procedure Laterality Date  . CORONARY ARTERY BYPASS GRAFT  04/02/1999  . FISSURECTOMY  2006    Current Medications: Outpatient Medications Prior to Visit  Medication Sig Dispense Refill  . aspirin 81 MG chewable tablet Chew 81 mg by mouth daily.    . Cholecalciferol (VITAMIN D3) 1000 units CAPS Take 1,000  Units by mouth daily.    . cyanocobalamin 500 MCG tablet Take 500 mcg by mouth daily.    . finasteride (PROSCAR) 5 MG tablet Take 5 mg by mouth daily.    . meclizine (ANTIVERT) 25 MG tablet Take 25 mg by mouth 3 (three) times daily as needed for dizziness (prn).    . metoprolol succinate (TOPROL-XL) 50 MG 24 hr tablet Take 25 mg by mouth daily. Take with or immediately following a meal.    . pantoprazole (PROTONIX) 40 MG tablet Take 40 mg by mouth daily.    . pravastatin (PRAVACHOL) 40 MG tablet TAKE 1 TABLET (40 MG TOTAL) BY MOUTH EVERY EVENING. 30 tablet 6  . sertraline (ZOLOFT) 50 MG tablet Take 50 mg by mouth daily.    Marland Kitchen zolpidem (AMBIEN CR) 12.5 MG CR tablet Take 10 mg by mouth at bedtime as needed. For sleep     No facility-administered medications prior to visit.      Allergies:   Benadryl [diphenhydramine hcl]   Social History   Socioeconomic History  . Marital status: Married    Spouse name: Not on file  . Number of children: Not on file  . Years of education: Not on file  . Highest education level: Not on file  Occupational History  . Not on file  Social Needs  . Financial resource strain: Not on file  . Food insecurity    Worry: Not on file    Inability: Not on file  . Transportation needs  Medical: Not on file    Non-medical: Not on file  Tobacco Use  . Smoking status: Former Smoker    Types: Cigarettes  . Smokeless tobacco: Former Systems developer    Quit date: 11/24/1992  Substance and Sexual Activity  . Alcohol use: No  . Drug use: No  . Sexual activity: Not on file  Lifestyle  . Physical activity    Days per week: Not on file    Minutes per session: Not on file  . Stress: Not on file  Relationships  . Social Herbalist on phone: Not on file    Gets together: Not on file    Attends religious service: Not on file    Active member of club or organization: Not on file    Attends meetings of clubs or organizations: Not on file    Relationship status:  Not on file  Other Topics Concern  . Not on file  Social History Narrative  . Not on file     Family History:  The patient's family history includes Cancer in his brother, father, maternal grandfather, maternal grandmother, sister, and sister; Diabetes in his mother; Heart attack in his paternal grandfather and paternal grandmother.   ROS:   Please see the history of present illness.    ROS all other systems are reviewed and are negative   PHYSICAL EXAM:   VS:  BP 132/78   Pulse 63   Temp (!) 96.9 F (36.1 C)   Ht 5\' 7"  (1.702 m)   Wt 160 lb 6.4 oz (72.8 kg)   SpO2 99%   BMI 25.12 kg/m     General: Alert, oriented x3, no distress, appears lean and his fifth Head: no evidence of trauma, PERRL, EOMI, no exophtalmos or lid lag, no myxedema, no xanthelasma; normal ears, nose and oropharynx Neck: normal jugular venous pulsations and no hepatojugular reflux; brisk carotid pulses without delay and no carotid bruits Chest: clear to auscultation, no signs of consolidation by percussion or palpation, normal fremitus, symmetrical and full respiratory excursions Cardiovascular: normal position and quality of the apical impulse, regular rhythm, normal first and second heart sounds, no murmurs, rubs or gallops Abdomen: no tenderness or distention, no masses by palpation, no abnormal pulsatility or arterial bruits, normal bowel sounds, no hepatosplenomegaly Extremities: no clubbing, cyanosis or edema; 2+ radial, ulnar and brachial pulses bilaterally; 2+ right femoral, posterior tibial and dorsalis pedis pulses; 2+ left femoral, posterior tibial and dorsalis pedis pulses; no subclavian or femoral bruits Neurological: grossly nonfocal Psych: Normal mood and affect   Wt Readings from Last 3 Encounters:  10/28/19 160 lb 6.4 oz (72.8 kg)  09/28/18 158 lb 12.8 oz (72 kg)  08/09/16 151 lb (68.5 kg)      Studies/Labs Reviewed:   EKG:  EKG is ordered today.  It shows sinus rhythm and old right  bundle branch block, no repolarization abnormalities, QTC 458 ms Recent Labs: 10/04/2019 Normal liver function tests, creatinine 0.9, glucose 105, potassium 4.8, hemoglobin 12.1 with normocytic indices, platelets 133k  Lipid Panel 10/04/2019 total cholesterol 158, triglycerides 117, HDL 29, LDL 92 03/30/2019 total cholesterol 144, triglycerides 232, HDL 27, LDL 71   ASSESSMENT:    1. Coronary artery disease of native artery of native heart with stable angina pectoris ([place])   2. Mixed hyperlipidemia   3. Right bundle branch block      PLAN:  In order of problems listed above:  1. CAD s/p CABG: The left upper chest  discomfort that he has been working in the yard is different from his previous angina.  If it is related to coronary insufficiency, the pattern is definitely above stable/exertional angina and he is only minimally symptomatic.  No change in medications and no plan for ischemia evaluation at this time. 2. HLP: Compliant with statin.  Seems he has a pattern of having better LDL levels in the spring, compared with the fall.  No change made to his medications. 3. OSA: He has no symptoms of daytime hypersomnolence and is no longer using CPAP.  He is at optimal weight..  4. 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  He does not have any symptoms to suggest episodes of high-grade AV block.   Medication Adjustments/Labs and Tests Ordered: Current medicines are reviewed at length with the patient today.  Concerns regarding medicines are outlined above.  Medication changes, Labs and Tests ordered today are listed in the Patient Instructions below. Patient Instructions  Medication Instructions:  No changes *If you need a refill on your cardiac medications before your next appointment, please call your pharmacy*  Lab Work: None ordered If you have labs (blood work) drawn today and your tests are completely normal, you will receive  your results only by: Marland Kitchen MyChart Message (if you have MyChart) OR . A paper copy in the mail If you have any lab test that is abnormal or we need to change your treatment, we will call you to review the results.  Testing/Procedures: None ordered  Follow-Up: At [facility], you and your health needs are our priority.  As part of our continuing mission to provide you with exceptional heart care, we have created designated Provider Care Teams.  These Care Teams include your primary Cardiologist (physician) and Advanced Practice Providers (APPs -  Physician Assistants and Nurse Practitioners) who all work together to provide you with the care you need, when you need it.  Your next appointment:   12 month(s)  The format for your next appointment:   In Person  Provider:   Sanda Klein, MD       Signed, Sanda Klein, MD  10/28/2019 10:19 AM    [address], [address], [place]  [address] Phone: [phone]; Fax: [phone]

## 2020-02-03 ENCOUNTER — Telehealth: Payer: Self-pay | Admitting: *Deleted

## 2020-02-03 NOTE — Telephone Encounter (Signed)
   [facility] Pre-operative Risk Assessment    Request for surgical clearance:  1. What type of surgery is being performed?  SHOULDER  2. When is this surgery scheduled?  TBD   3. What type of clearance is required (medical clearance vs. Pharmacy clearance to hold med vs. Both)? BOTH  4. Are there any medications that need to be held prior to surgery and how long? ASA   5. Practice name and name of physician performing surgery?  [facility]    6. What is your office phone number [phone]    7.   What is your office fax number [phone]  8.   Anesthesia type (None, local, MAC, general) ?  GENERAL   Devra Dopp 02/03/2020, 11:32 AM  _________________________________________________________________   (provider comments below)

## 2020-02-03 NOTE — Telephone Encounter (Signed)
Left VM for pt call back to update status for preop clearance.

## 2020-02-10 NOTE — Telephone Encounter (Signed)
   Primary Cardiologist: Sanda Klein, MD  Chart reviewed as part of pre-operative protocol coverage. Patient was contacted 02/10/2020 in reference to pre-operative risk assessment for pending surgery as outlined below.  CHAMP GIALANELLA was last seen on 10/2019 by Dr. Sallyanne Kuster.  Since that day, CAMAURI HONTS has done well. Easily getting > 4 mets of activity.   Therefore, based on ACC/AHA guidelines, the patient would be at acceptable risk for the planned procedure without further cardiovascular testing.   Dr. Sallyanne Kuster, can patient hold ASA for 5-7 days? Please forward your response to [facility] PREOP.   Thank you   Leanor Kail, PA 02/10/2020, 9:08 AM

## 2020-02-10 NOTE — Telephone Encounter (Signed)
Yes, he can hold ASA for 5-7 days preop. Please make sure his metoprolol is continued, including on the day of the surgery.

## 2020-02-24 ENCOUNTER — Telehealth: Payer: Self-pay | Admitting: Cardiovascular Disease

## 2020-02-24 NOTE — Telephone Encounter (Signed)
New Message    Pt is calling and is wondering if he can be cleared to have surgery done on his arm. He says the office wanting to perform the procedure is waiting from a response from the clearance request they sent over    Please advise

## 2020-02-24 NOTE — Telephone Encounter (Signed)
Patient called with pre-op clearance information as documented bu Vin PA and Dr. Sallyanne Kuster

## 2020-02-24 NOTE — Telephone Encounter (Signed)
Pre-op clearance was sent on the 18th, per Vins note and I just re sent.

## 2020-02-24 NOTE — Telephone Encounter (Signed)
Routed to pre-op to address

## 2020-02-28 ENCOUNTER — Ambulatory Visit: Payer: Self-pay | Admitting: Orthopedic Surgery

## 2020-03-07 ENCOUNTER — Encounter (HOSPITAL_COMMUNITY): Payer: Self-pay

## 2020-03-07 NOTE — Patient Instructions (Addendum)
DUE TO COVID-19 ONLY TWO VISITORS ARE ALLOWED TO COME WITH YOU AND STAY IN THE WAITING ROOM ONLY DURING PRE OP AND PROCEDURE. THE TWO VISITORS MAY VISIT WITH YOU IN YOUR PRIVATE ROOM DURING VISITING HOURS ONLY!!   COVID SWAB TESTING MUST BE COMPLETED ON:  Saturday, March 18, 2020 at   [address], [place] pre surgical [place] (Must self quarantine after testing. Follow instructions on handout.)             Your procedure is scheduled on: Wednesday, March 22, 2020   Report to [facility] Main  Entrance   Report to Short Stay at 5:30 AM   [facility])   Call this number if you have problems the morning of surgery [phone]   Do not eat food :After Midnight.   May have liquids until 4:30 AM day of surgery   CLEAR LIQUID DIET  Foods Allowed                                                                     Foods Excluded  Water, Black Coffee and tea, regular and decaf                             liquids that you cannot  Plain Jell-O in any flavor  (No red)                                           see through such as: Fruit ices (not with fruit pulp)                                     milk, soups, orange juice  Iced Popsicles (No red)                                    All solid food Carbonated beverages, regular and diet                                    Apple juices Sports drinks like Gatorade (No red) Lightly seasoned clear broth or consume(fat free) Sugar, honey syrup  Sample Menu Breakfast                                Lunch                                     Supper Cranberry juice                    Beef broth  Chicken broth Jell-O                                     Grape juice                           Apple juice Coffee or tea                        Jell-O                                      Popsicle                                                Coffee or tea                         Coffee or tea   Complete one Ensure drink the morning of surgery at 4:30 AM the day of surgery.   Oral Hygiene is also important to reduce your risk of infection.                                    Remember - BRUSH YOUR TEETH THE MORNING OF SURGERY WITH YOUR REGULAR TOOTHPASTE   Do NOT smoke after Midnight   Take these medicines the morning of surgery with A SIP OF WATER: Finasteride, Pantoprazole, Sertraline                               You may not have any metal on your body including jewelry, and body piercings             Do not wear lotions, powders, perfumes/cologne, or deodorant                          Men may shave face and neck.   Do not bring valuables to the hospital. [place].   Contacts, dentures or bridgework may not be worn into surgery.    Patients discharged the day of surgery will not be allowed to drive home.   Special Instructions: Bring a copy of your healthcare power of attorney and living will documents         the day of surgery if you haven't scanned them in before.              Please read over the following fact sheets you were given: IF YOU HAVE QUESTIONS ABOUT YOUR PRE OP INSTRUCTIONS PLEASE CALL [phone]   [place] - Preparing for Surgery Before surgery, you can play an important role.  Because skin is not sterile, your skin needs to be as free of germs as possible.  You can reduce the number of germs on your skin by washing with CHG (chlorahexidine gluconate) soap before surgery.  CHG is an antiseptic cleaner which kills germs and  bonds with the skin to continue killing germs even after washing. Please DO NOT use if you have an allergy to CHG or antibacterial soaps.  If your skin becomes reddened/irritated stop using the CHG and inform your nurse when you arrive at Short Stay. Do not shave (including legs and underarms) for at least 48 hours prior to the first CHG shower.  You may shave your  face/neck.  Please follow these instructions carefully:  1.  Shower with CHG Soap the night before surgery and the  morning of surgery.  2.  If you choose to wash your hair, wash your hair first as usual with your normal  shampoo.  3.  After you shampoo, rinse your hair and body thoroughly to remove the shampoo.                             4.  Use CHG as you would any other liquid soap.  You can apply chg directly to the skin and wash.  Gently with a scrungie or clean washcloth.  5.  Apply the CHG Soap to your body ONLY FROM THE NECK DOWN.   Do   not use on face/ open                           Wound or open sores. Avoid contact with eyes, ears mouth and   genitals (private parts).                       Wash face,  Genitals (private parts) with your normal soap.             6.  Wash thoroughly, paying special attention to the area where your    surgery  will be performed.  7.  Thoroughly rinse your body with warm water from the neck down.  8.  DO NOT shower/wash with your normal soap after using and rinsing off the CHG Soap.                9.  Pat yourself dry with a clean towel.            10.  Wear clean pajamas.            11.  Place clean sheets on your bed the night of your first shower and do not  sleep with pets. Day of Surgery : Do not apply any lotions/deodorants the morning of surgery.  Please wear clean clothes to the hospital/surgery center.  FAILURE TO FOLLOW THESE INSTRUCTIONS MAY RESULT IN THE CANCELLATION OF YOUR SURGERY  PATIENT SIGNATURE_________________________________  NURSE SIGNATURE__________________________________  ________________________________________________________________________   Adam Phenix  An incentive spirometer is a tool that can help keep your lungs clear and active. This tool measures how well you are filling your lungs with each breath. Taking long deep breaths may help reverse or decrease the chance of developing breathing (pulmonary)  problems (especially infection) following:  A long period of time when you are unable to move or be active. BEFORE THE PROCEDURE   If the spirometer includes an indicator to show your best effort, your nurse or respiratory therapist will set it to a desired goal.  If possible, sit up straight or lean slightly forward. Try not to slouch.  Hold the incentive spirometer in an upright position. INSTRUCTIONS FOR USE  1. Sit on the edge of your bed if possible,  or sit up as far as you can in bed or on a chair. 2. Hold the incentive spirometer in an upright position. 3. Breathe out normally. 4. Place the mouthpiece in your mouth and seal your lips tightly around it. 5. Breathe in slowly and as deeply as possible, raising the piston or the ball toward the top of the column. 6. Hold your breath for 3-5 seconds or for as long as possible. Allow the piston or ball to fall to the bottom of the column. 7. Remove the mouthpiece from your mouth and breathe out normally. 8. Rest for a few seconds and repeat Steps 1 through 7 at least 10 times every 1-2 hours when you are awake. Take your time and take a few normal breaths between deep breaths. 9. The spirometer may include an indicator to show your best effort. Use the indicator as a goal to work toward during each repetition. 10. After each set of 10 deep breaths, practice coughing to be sure your lungs are clear. If you have an incision (the cut made at the time of surgery), support your incision when coughing by placing a pillow or rolled up towels firmly against it. Once you are able to get out of bed, walk around indoors and cough well. You may stop using the incentive spirometer when instructed by your caregiver.  RISKS AND COMPLICATIONS  Take your time so you do not get dizzy or light-headed.  If you are in pain, you may need to take or ask for pain medication before doing incentive spirometry. It is harder to take a deep breath if you are having  pain. AFTER USE  Rest and breathe slowly and easily.  It can be helpful to keep track of a log of your progress. Your caregiver can provide you with a simple table to help with this. If you are using the spirometer at home, follow these instructions: [place] IF:   You are having difficultly using the spirometer.  You have trouble using the spirometer as often as instructed.  Your pain medication is not giving enough relief while using the spirometer.  You develop fever of 100.5 F (38.1 C) or higher. SEEK IMMEDIATE MEDICAL CARE IF:   You cough up bloody sputum that had not been present before.  You develop fever of 102 F (38.9 C) or greater.  You develop worsening pain at or near the incision site. MAKE SURE YOU:   Understand these instructions.  Will watch your condition.  Will get help right away if you are not doing well or get worse. Document Released: 03/24/2007 Document Revised: 02/03/2012 Document Reviewed: 05/25/2007 [facility] Patient Information 2014 [place], [place].   ________________________________________________________________________

## 2020-03-13 ENCOUNTER — Encounter (HOSPITAL_COMMUNITY)
Admission: RE | Admit: 2020-03-13 | Discharge: 2020-03-13 | Disposition: A | Discharge status: Home / Self Care | Payer: Medicare HMO | Source: Ambulatory Visit | Attending: Specialist | Admitting: Specialist

## 2020-03-13 ENCOUNTER — Other Ambulatory Visit: Payer: Self-pay

## 2020-03-13 ENCOUNTER — Encounter (HOSPITAL_COMMUNITY): Payer: Self-pay

## 2020-03-13 DIAGNOSIS — G4733 Obstructive sleep apnea (adult) (pediatric): Secondary | ICD-10-CM | POA: Diagnosis not present

## 2020-03-13 DIAGNOSIS — I2510 Atherosclerotic heart disease of native coronary artery without angina pectoris: Secondary | ICD-10-CM | POA: Insufficient documentation

## 2020-03-13 DIAGNOSIS — M75102 Unspecified rotator cuff tear or rupture of left shoulder, not specified as traumatic: Secondary | ICD-10-CM | POA: Diagnosis not present

## 2020-03-13 DIAGNOSIS — Z87891 Personal history of nicotine dependence: Secondary | ICD-10-CM | POA: Diagnosis not present

## 2020-03-13 DIAGNOSIS — Z7982 Long term (current) use of aspirin: Secondary | ICD-10-CM | POA: Diagnosis not present

## 2020-03-13 DIAGNOSIS — Z951 Presence of aortocoronary bypass graft: Secondary | ICD-10-CM | POA: Diagnosis not present

## 2020-03-13 DIAGNOSIS — Z79899 Other long term (current) drug therapy: Secondary | ICD-10-CM | POA: Diagnosis not present

## 2020-03-13 DIAGNOSIS — E785 Hyperlipidemia, unspecified: Secondary | ICD-10-CM | POA: Insufficient documentation

## 2020-03-13 DIAGNOSIS — Z01812 Encounter for preprocedural laboratory examination: Secondary | ICD-10-CM | POA: Diagnosis present

## 2020-03-13 HISTORY — DX: Gastro-esophageal reflux disease without esophagitis: K21.9

## 2020-03-13 HISTORY — DX: Unspecified osteoarthritis, unspecified site: M19.90

## 2020-03-13 HISTORY — DX: Iron deficiency anemia, unspecified: D50.9

## 2020-03-13 HISTORY — DX: Tinnitus, unspecified ear: H93.19

## 2020-03-13 LAB — CBC WITH DIFFERENTIAL/PLATELET
Abs Immature Granulocytes: 0.04 10*3/uL (ref 0.00–0.07)
Basophils Absolute: 0 10*3/uL (ref 0.0–0.1)
Basophils Relative: 0 %
Eosinophils Absolute: 0.1 10*3/uL (ref 0.0–0.5)
Eosinophils Relative: 2 %
HCT: 38 % — ABNORMAL LOW (ref 39.0–52.0)
Hemoglobin: 12.1 g/dL — ABNORMAL LOW (ref 13.0–17.0)
Immature Granulocytes: 1 %
Lymphocytes Relative: 25 %
Lymphs Abs: 1.7 10*3/uL (ref 0.7–4.0)
MCH: 27.3 pg (ref 26.0–34.0)
MCHC: 31.8 g/dL (ref 30.0–36.0)
MCV: 85.8 fL (ref 80.0–100.0)
Monocytes Absolute: 0.4 10*3/uL (ref 0.1–1.0)
Monocytes Relative: 7 %
Neutro Abs: 4.3 10*3/uL (ref 1.7–7.7)
Neutrophils Relative %: 65 %
Platelets: 162 10*3/uL (ref 150–400)
RBC: 4.43 MIL/uL (ref 4.22–5.81)
RDW: 13.5 % (ref 11.5–15.5)
WBC: 6.6 10*3/uL (ref 4.0–10.5)
nRBC: 0 % (ref 0.0–0.2)

## 2020-03-13 LAB — BASIC METABOLIC PANEL
Anion gap: 9 (ref 5–15)
BUN: 14 mg/dL (ref 8–23)
CO2: 26 mmol/L (ref 22–32)
Calcium: 9 mg/dL (ref 8.9–10.3)
Chloride: 105 mmol/L (ref 98–111)
Creatinine, Ser: 0.91 mg/dL (ref 0.61–1.24)
GFR calc Af Amer: >60 mL/min (ref >60)
GFR calc non Af Amer: >60 mL/min (ref >60)
Glucose, Bld: 122 mg/dL — ABNORMAL HIGH (ref 70–99)
Potassium: 4.5 mmol/L (ref 3.5–5.1)
Sodium: 140 mmol/L (ref 135–145)

## 2020-03-13 NOTE — Progress Notes (Signed)
PCP - Dr. Audie Pinto last office visit 3/21 Cardiologist - Dr. Jerilynn Mages. Croitoru last office visit 10/28/19 in epic, clearance 02/03/20 telephone encounter in epic B. Bhagat P.A.  Chest x-ray - greater than 1 year EKG - 10/28/19 in epic Stress Test - greater than 2 year ECHO - greater than 2 year Cardiac Cath - greater than 2 year  Sleep Study - greater than 2 year CPAP - No CPAP  Fasting Blood Sugar - N/A Checks Blood Sugar __N/A___ times a day  Blood Thinner Instructions: N/A Aspirin Instructions: Yes Last Dose: 5-7 days prior to surgery  Anesthesia review: CAD, OSA, CABG  Patient denies shortness of breath, fever, cough and chest pain at PAT appointment   Patient verbalized understanding of instructions that were given to them at the PAT appointment. Patient was also instructed that they will need to review over the PAT instructions again at home before surgery.

## 2020-03-15 NOTE — Progress Notes (Signed)
Anesthesia Chart Review   Case: [id number] Date/Time: 03/22/20 0715   Procedure: SHOULDER ARTHROSCOPY WITH MINI OPEN ROTATOR CUFF REPAIR AND SUBACROMIAL DECOMPRESSION (Left ) - 90 mins   Anesthesia type: Choice   Pre-op diagnosis: Left shoulder rotator cuff tear   Location: [facility] ROOM 08 / [facility]   Surgeons: Susa Day, MD      DISCUSSION:73 y.o. former smoker (quit 11/26/91) with h/o HLD, GERD, OSA, RBBB, CAD s/p CABG, left shoulder rotator cuff tear scheduled for above procedure 03/22/2020 with Dr. Susa Day.   Clearance received from cardiologist 02/10/2020.  Per [employer], PA-C, "Chart reviewed as part of pre-operative protocol coverage. Patient was contacted 02/10/2020 in reference to pre-operative risk assessment for pending surgery as outlined below.  Timothy Gates was last seen on 10/2019 by Dr. Sallyanne Kuster.  Since that day, Timothy Gates has done well. Easily getting > 4 mets of activity.  Therefore, based on ACC/AHA guidelines, the patient would be at acceptable risk for the planned procedure without further cardiovascular testing."  Per Dr. Sallyanne Kuster pt can hold ASA 5-7 days prior to procedure.   Anticipate pt can proceed with planned procedure barring acute status change.   VS: BP (!) 148/69   Pulse 67   Temp 37 C (Oral)   Resp 18   Ht 5\' 7"  (1.702 m)   Wt 73.1 kg   SpO2 99%   BMI 25.23 kg/m   PROVIDERS: Deland Pretty, MD is PCP   Sanda Klein, MD is Cardiologist  LABS: Labs reviewed: Acceptable for surgery. (all labs ordered are listed, but only abnormal results are displayed)  Labs Reviewed  BASIC METABOLIC PANEL - Abnormal; Notable for the following components:      Result Value   Glucose, Bld 122 (*)    All other components within normal limits  CBC WITH DIFFERENTIAL/PLATELET - Abnormal; Notable for the following components:   Hemoglobin 12.1 (*)    HCT 38.0 (*)    All other components within normal limits      IMAGES:   EKG: 10/28/2019 Rate 64 bpm Normal sinus rhythm  Right bundle branch block   CV: Myocardial 08/09/2016  The left ventricular ejection fraction is normal (55-65%).  Nuclear stress EF: 58%. Septal wall hypokinesis, post CABG  Horizontal ST segment depression ST segment depression of 1 mm was noted during stress in the V5 leads.  This is a low risk study. No ischemia identified. No perfusion defects Past Medical History:  Diagnosis Date  . Arthritis    lower back  . CAD (coronary artery disease)   . GERD (gastroesophageal reflux disease)   . Hyperlipidemia   . Iron deficiency anemia   . OSA (obstructive sleep apnea)   . RBBB (right bundle branch block) 10/28/2019   Noted on EKG  . Tinnitus    Left ear    Past Surgical History:  Procedure Laterality Date  . CORONARY ARTERY BYPASS GRAFT  04/02/1999  . DG BARIUM SWALLOW ([place] HX)    . FISSURECTOMY  2006  . SIGMOIDOSCOPY    . UPPER GI ENDOSCOPY      MEDICATIONS: . aspirin 81 MG chewable tablet  . Cholecalciferol (VITAMIN D3) 1000 units CAPS  . finasteride (PROSCAR) 5 MG tablet  . HYDROcodone-acetaminophen (NORCO/VICODIN) 5-325 MG tablet  . Lidocaine-Glycerin (PREPARATION H EX)  . metoprolol succinate (TOPROL-XL) 50 MG 24 hr tablet  . pantoprazole (PROTONIX) 40 MG tablet  . Pediatric Multivitamins-Iron (CHILDRENS MULTIVITAMIN/IRON PO)  . pravastatin (PRAVACHOL) 40 MG  tablet  . sertraline (ZOLOFT) 50 MG tablet  . vitamin B-12 (CYANOCOBALAMIN) 1000 MCG tablet  . zolpidem (AMBIEN) 10 MG tablet   No current facility-administered medications for this encounter.     Maia Plan [facility] Pre-Surgical Testing [phone] 03/15/20  2:19 PM

## 2020-03-18 ENCOUNTER — Other Ambulatory Visit (HOSPITAL_COMMUNITY)
Admission: RE | Admit: 2020-03-18 | Discharge: 2020-03-18 | Disposition: A | Discharge status: Home / Self Care | Payer: Medicare HMO | Source: Ambulatory Visit | Attending: Specialist | Admitting: Specialist

## 2020-03-18 DIAGNOSIS — Z01812 Encounter for preprocedural laboratory examination: Secondary | ICD-10-CM | POA: Insufficient documentation

## 2020-03-18 DIAGNOSIS — Z20822 Contact with and (suspected) exposure to covid-19: Secondary | ICD-10-CM | POA: Diagnosis not present

## 2020-03-18 LAB — SARS CORONAVIRUS 2 (TAT 6-24 HRS): SARS Coronavirus 2: NEGATIVE

## 2020-03-21 ENCOUNTER — Ambulatory Visit: Payer: Self-pay | Admitting: Orthopedic Surgery

## 2020-03-21 ENCOUNTER — Encounter (HOSPITAL_COMMUNITY): Payer: Self-pay | Admitting: Specialist

## 2020-03-21 NOTE — H&P (Signed)
Timothy Gates is an 74 y.o. male.   Chief Complaint: L shoulder pain HPI: Visit For: Follow up (to discuss MRI) Hand Dominance: right Location: left; shoulder Severity: pain level 5/10 Alleviating Factors: Hydrocodone Aggravating Factors: ROM  Past Medical History:  Diagnosis Date  . Arthritis    lower back  . CAD (coronary artery disease)   . GERD (gastroesophageal reflux disease)   . Hyperlipidemia   . Iron deficiency anemia   . OSA (obstructive sleep apnea)   . RBBB (right bundle branch block) 10/28/2019   Noted on EKG  . Tinnitus    Left ear    Past Surgical History:  Procedure Laterality Date  . CORONARY ARTERY BYPASS GRAFT  04/02/1999  . DG BARIUM SWALLOW ([place] HX)    . FISSURECTOMY  2006  . SIGMOIDOSCOPY    . UPPER GI ENDOSCOPY      Family History  Problem Relation Age of Onset  . Cancer Father   . Diabetes Mother   . Cancer Sister   . Cancer Sister   . Cancer Maternal Grandmother   . Cancer Maternal Grandfather   . Heart attack Paternal Grandmother   . Heart attack Paternal Grandfather   . Cancer Brother    Social History:  reports that he quit smoking about 28 years ago. His smoking use included cigarettes. He quit smokeless tobacco use about 27 years ago. He reports that he does not drink alcohol or use drugs.  Allergies:  Allergies  Allergen Reactions  . Benadryl [Diphenhydramine Hcl] Other (See Comments)    Headache    (Not in a hospital admission)   No results found for this or any previous visit (from the past 48 hour(s)). No results found.  Review of Systems  Constitutional: Negative.   HENT: Negative.   Eyes: Negative.   Respiratory: Negative.   Cardiovascular: Negative.   Gastrointestinal: Negative.   Genitourinary: Negative.   Musculoskeletal: Positive for arthralgias.  Neurological: Negative.   Psychiatric/Behavioral: Negative.     There were no vitals taken for this visit. Physical Exam  Constitutional: He is oriented to  person, place, and time. He appears well-developed and well-nourished.  HENT:  Head: Normocephalic.  Eyes: Pupils are equal, round, and reactive to light.  Cardiovascular: Normal rate.  Respiratory: Effort normal.  GI: Soft.  Musculoskeletal:     Cervical back: Normal range of motion.     Comments: Left shoulder positive impingement sign positive second impingement sign. Active abduction is 60. Nontender over the [facility]. Internal rotation to L5. Some weakness in internal rotation. Neurovascularly intact.  Neurological: He is alert and oriented to person, place, and time.  Skin: Skin is warm and dry.    MRI positive for RCT  Assessment/Plan L shoulder RCT  Dr Tonita Cong discussed procedure, risk, complications with pt and he desires to proceed  Plan L shoulder mini-open RCR, SAD  Cecilie Kicks, PA-C for Dr. Tonita Cong 03/21/2020, 4:58 PM

## 2020-03-21 NOTE — H&P (View-Only) (Signed)
Timothy Gates is an 74 y.o. male.   Chief Complaint: L shoulder pain HPI: Visit For: Follow up (to discuss MRI) Hand Dominance: right Location: left; shoulder Severity: pain level 5/10 Alleviating Factors: Hydrocodone Aggravating Factors: ROM  Past Medical History:  Diagnosis Date  . Arthritis    lower back  . CAD (coronary artery disease)   . GERD (gastroesophageal reflux disease)   . Hyperlipidemia   . Iron deficiency anemia   . OSA (obstructive sleep apnea)   . RBBB (right bundle branch block) 10/28/2019   Noted on EKG  . Tinnitus    Left ear    Past Surgical History:  Procedure Laterality Date  . CORONARY ARTERY BYPASS GRAFT  04/02/1999  . DG BARIUM SWALLOW ([place] HX)    . FISSURECTOMY  2006  . SIGMOIDOSCOPY    . UPPER GI ENDOSCOPY      Family History  Problem Relation Age of Onset  . Cancer Father   . Diabetes Mother   . Cancer Sister   . Cancer Sister   . Cancer Maternal Grandmother   . Cancer Maternal Grandfather   . Heart attack Paternal Grandmother   . Heart attack Paternal Grandfather   . Cancer Brother    Social History:  reports that he quit smoking about 28 years ago. His smoking use included cigarettes. He quit smokeless tobacco use about 27 years ago. He reports that he does not drink alcohol or use drugs.  Allergies:  Allergies  Allergen Reactions  . Benadryl [Diphenhydramine Hcl] Other (See Comments)    Headache    (Not in a hospital admission)   No results found for this or any previous visit (from the past 48 hour(s)). No results found.  Review of Systems  Constitutional: Negative.   HENT: Negative.   Eyes: Negative.   Respiratory: Negative.   Cardiovascular: Negative.   Gastrointestinal: Negative.   Genitourinary: Negative.   Musculoskeletal: Positive for arthralgias.  Neurological: Negative.   Psychiatric/Behavioral: Negative.     There were no vitals taken for this visit. Physical Exam  Constitutional: He is oriented to  person, place, and time. He appears well-developed and well-nourished.  HENT:  Head: Normocephalic.  Eyes: Pupils are equal, round, and reactive to light.  Cardiovascular: Normal rate.  Respiratory: Effort normal.  GI: Soft.  Musculoskeletal:     Cervical back: Normal range of motion.     Comments: Left shoulder positive impingement sign positive second impingement sign. Active abduction is 60. Nontender over the [facility]. Internal rotation to L5. Some weakness in internal rotation. Neurovascularly intact.  Neurological: He is alert and oriented to person, place, and time.  Skin: Skin is warm and dry.    MRI positive for RCT  Assessment/Plan L shoulder RCT  Dr Tonita Cong discussed procedure, risk, complications with pt and he desires to proceed  Plan L shoulder mini-open RCR, SAD  Cecilie Kicks, PA-C for Dr. Tonita Cong 03/21/2020, 4:58 PM

## 2020-03-22 ENCOUNTER — Ambulatory Visit (HOSPITAL_COMMUNITY): Payer: Medicare HMO | Admitting: Physician Assistant

## 2020-03-22 ENCOUNTER — Ambulatory Visit (HOSPITAL_COMMUNITY)
Admission: RE | Admit: 2020-03-22 | Discharge: 2020-03-22 | Disposition: A | Discharge status: Home / Self Care | Payer: Medicare HMO | Attending: Specialist | Admitting: Specialist

## 2020-03-22 ENCOUNTER — Ambulatory Visit (HOSPITAL_COMMUNITY): Payer: Medicare HMO | Admitting: Anesthesiology

## 2020-03-22 ENCOUNTER — Encounter (HOSPITAL_COMMUNITY): Payer: Self-pay | Admitting: Specialist

## 2020-03-22 ENCOUNTER — Other Ambulatory Visit: Payer: Self-pay

## 2020-03-22 ENCOUNTER — Encounter (HOSPITAL_COMMUNITY)
Admission: RE | Disposition: A | Discharge status: Home / Self Care | Payer: Self-pay | Source: Home / Self Care | Attending: Specialist

## 2020-03-22 DIAGNOSIS — G4733 Obstructive sleep apnea (adult) (pediatric): Secondary | ICD-10-CM | POA: Insufficient documentation

## 2020-03-22 DIAGNOSIS — M1990 Unspecified osteoarthritis, unspecified site: Secondary | ICD-10-CM | POA: Diagnosis not present

## 2020-03-22 DIAGNOSIS — M75102 Unspecified rotator cuff tear or rupture of left shoulder, not specified as traumatic: Secondary | ICD-10-CM | POA: Diagnosis present

## 2020-03-22 DIAGNOSIS — I4510 Unspecified right bundle-branch block: Secondary | ICD-10-CM | POA: Insufficient documentation

## 2020-03-22 DIAGNOSIS — G4730 Sleep apnea, unspecified: Secondary | ICD-10-CM | POA: Diagnosis not present

## 2020-03-22 DIAGNOSIS — M7542 Impingement syndrome of left shoulder: Secondary | ICD-10-CM | POA: Insufficient documentation

## 2020-03-22 DIAGNOSIS — Z951 Presence of aortocoronary bypass graft: Secondary | ICD-10-CM | POA: Insufficient documentation

## 2020-03-22 DIAGNOSIS — Z8249 Family history of ischemic heart disease and other diseases of the circulatory system: Secondary | ICD-10-CM | POA: Diagnosis not present

## 2020-03-22 DIAGNOSIS — E785 Hyperlipidemia, unspecified: Secondary | ICD-10-CM | POA: Diagnosis not present

## 2020-03-22 DIAGNOSIS — Z87891 Personal history of nicotine dependence: Secondary | ICD-10-CM | POA: Diagnosis not present

## 2020-03-22 DIAGNOSIS — I2510 Atherosclerotic heart disease of native coronary artery without angina pectoris: Secondary | ICD-10-CM | POA: Insufficient documentation

## 2020-03-22 DIAGNOSIS — K219 Gastro-esophageal reflux disease without esophagitis: Secondary | ICD-10-CM | POA: Insufficient documentation

## 2020-03-22 HISTORY — PX: SHOULDER ARTHROSCOPY WITH ROTATOR CUFF REPAIR AND SUBACROMIAL DECOMPRESSION: SHX5686

## 2020-03-22 SURGERY — SHOULDER ARTHROSCOPY WITH ROTATOR CUFF REPAIR AND SUBACROMIAL DECOMPRESSION
Anesthesia: General | Laterality: Left

## 2020-03-22 MED ORDER — CEPHALEXIN 500 MG PO CAPS: 500.0000 mg | ORAL_CAPSULE | Freq: Four times a day (QID) | ORAL | 0 refills | Status: DC

## 2020-03-22 MED ORDER — PROPOFOL 10 MG/ML IV BOLUS
INTRAVENOUS | Status: DC | PRN
  Administered 2020-03-22: 100 mg via INTRAVENOUS

## 2020-03-22 MED ORDER — BUPIVACAINE-EPINEPHRINE 0.5% -1:200000 IJ SOLN
INTRAMUSCULAR | Status: DC | PRN
  Administered 2020-03-22: 10 mL

## 2020-03-22 MED ORDER — ACETAMINOPHEN 10 MG/ML IV SOLN: 1000.0000 mg | Freq: Once | INTRAVENOUS | Status: DC | PRN

## 2020-03-22 MED ORDER — ONDANSETRON HCL 4 MG/2ML IJ SOLN
INTRAMUSCULAR | Status: AC
  Filled 2020-03-22: qty 6

## 2020-03-22 MED ORDER — METHOCARBAMOL 500 MG PO TABS: 500.0000 mg | ORAL_TABLET | Freq: Three times a day (TID) | ORAL | 1 refills | Status: DC | PRN

## 2020-03-22 MED ORDER — MIDAZOLAM HCL 2 MG/2ML IJ SOLN
INTRAMUSCULAR | Status: AC
  Filled 2020-03-22: qty 2

## 2020-03-22 MED ORDER — SUGAMMADEX SODIUM 500 MG/5ML IV SOLN
INTRAVENOUS | Status: AC
  Filled 2020-03-22: qty 5

## 2020-03-22 MED ORDER — DEXAMETHASONE SODIUM PHOSPHATE 10 MG/ML IJ SOLN
INTRAMUSCULAR | Status: AC
  Filled 2020-03-22: qty 3

## 2020-03-22 MED ORDER — SODIUM CHLORIDE 0.9 % IV SOLN
INTRAVENOUS | Status: DC | PRN
  Administered 2020-03-22: 08:00:00 500 mL

## 2020-03-22 MED ORDER — BUPIVACAINE LIPOSOME 1.3 % IJ SUSP
INTRAMUSCULAR | Status: DC | PRN
  Administered 2020-03-22: 10 mL via PERINEURAL

## 2020-03-22 MED ORDER — OXYCODONE HCL 5 MG PO TABS: 5.0000 mg | ORAL_TABLET | Freq: Once | ORAL | Status: DC | PRN

## 2020-03-22 MED ORDER — EPINEPHRINE PF 1 MG/ML IJ SOLN
INTRAMUSCULAR | Status: AC
  Filled 2020-03-22: qty 4

## 2020-03-22 MED ORDER — OXYCODONE-ACETAMINOPHEN 5-325 MG PO TABS: 1.0000 | ORAL_TABLET | ORAL | 0 refills | Status: DC | PRN

## 2020-03-22 MED ORDER — ROCURONIUM BROMIDE 10 MG/ML (PF) SYRINGE
PREFILLED_SYRINGE | INTRAVENOUS | Status: AC
  Filled 2020-03-22: qty 20

## 2020-03-22 MED ORDER — LIDOCAINE 2% (20 MG/ML) 5 ML SYRINGE
INTRAMUSCULAR | Status: AC
  Filled 2020-03-22: qty 10

## 2020-03-22 MED ORDER — LIDOCAINE 2% (20 MG/ML) 5 ML SYRINGE
INTRAMUSCULAR | Status: DC | PRN
  Administered 2020-03-22: 50 mg via INTRAVENOUS

## 2020-03-22 MED ORDER — DEXAMETHASONE SODIUM PHOSPHATE 10 MG/ML IJ SOLN
INTRAMUSCULAR | Status: DC | PRN
  Administered 2020-03-22: 8 mg via INTRAVENOUS

## 2020-03-22 MED ORDER — BUPIVACAINE HCL (PF) 0.5 % IJ SOLN
INTRAMUSCULAR | Status: DC | PRN
  Administered 2020-03-22: 15 mL via PERINEURAL

## 2020-03-22 MED ORDER — SUGAMMADEX SODIUM 200 MG/2ML IV SOLN
INTRAVENOUS | Status: DC | PRN
  Administered 2020-03-22: 300 mg via INTRAVENOUS

## 2020-03-22 MED ORDER — MIDAZOLAM HCL 5 MG/5ML IJ SOLN
INTRAMUSCULAR | Status: DC | PRN
  Administered 2020-03-22: 1 mg via INTRAVENOUS

## 2020-03-22 MED ORDER — FENTANYL CITRATE (PF) 100 MCG/2ML IJ SOLN: 25.0000 ug | INTRAMUSCULAR | Status: DC | PRN

## 2020-03-22 MED ORDER — FENTANYL CITRATE (PF) 100 MCG/2ML IJ SOLN
INTRAMUSCULAR | Status: DC | PRN
  Administered 2020-03-22 (×2): 50 ug via INTRAVENOUS

## 2020-03-22 MED ORDER — CEFAZOLIN SODIUM-DEXTROSE 2-4 GM/100ML-% IV SOLN
2.0000 g | INTRAVENOUS | Status: AC
  Administered 2020-03-22: 2 g via INTRAVENOUS
  Filled 2020-03-22: qty 100

## 2020-03-22 MED ORDER — LACTATED RINGERS IV SOLN: INTRAVENOUS | Status: DC

## 2020-03-22 MED ORDER — PROPOFOL 10 MG/ML IV BOLUS
INTRAVENOUS | Status: AC
  Filled 2020-03-22: qty 40

## 2020-03-22 MED ORDER — CEPHALEXIN 500 MG PO CAPS: 500.0000 mg | ORAL_CAPSULE | Freq: Four times a day (QID) | ORAL | 0 refills | Status: AC

## 2020-03-22 MED ORDER — ONDANSETRON HCL 4 MG/2ML IJ SOLN
INTRAMUSCULAR | Status: DC | PRN
  Administered 2020-03-22: 4 mg via INTRAVENOUS

## 2020-03-22 MED ORDER — PROPOFOL 10 MG/ML IV BOLUS
INTRAVENOUS | Status: AC
  Filled 2020-03-22: qty 20

## 2020-03-22 MED ORDER — PHENYLEPHRINE HCL-NACL 10-0.9 MG/250ML-% IV SOLN
INTRAVENOUS | Status: DC | PRN
Start: 2020-03-22 — End: 2020-03-22
  Administered 2020-03-22: 20 ug/min via INTRAVENOUS

## 2020-03-22 MED ORDER — BUPIVACAINE-EPINEPHRINE (PF) 0.5% -1:200000 IJ SOLN
INTRAMUSCULAR | Status: AC
  Filled 2020-03-22: qty 30

## 2020-03-22 MED ORDER — SODIUM CHLORIDE 0.9 % IV SOLN
INTRAVENOUS | Status: AC
  Filled 2020-03-22: qty 500000

## 2020-03-22 MED ORDER — FENTANYL CITRATE (PF) 100 MCG/2ML IJ SOLN
INTRAMUSCULAR | Status: AC
  Filled 2020-03-22: qty 2

## 2020-03-22 MED ORDER — ROCURONIUM BROMIDE 10 MG/ML (PF) SYRINGE
PREFILLED_SYRINGE | INTRAVENOUS | Status: DC | PRN
  Administered 2020-03-22: 60 mg via INTRAVENOUS

## 2020-03-22 MED ORDER — OXYCODONE HCL 5 MG/5ML PO SOLN: 5.0000 mg | Freq: Once | ORAL | Status: DC | PRN

## 2020-03-22 SURGICAL SUPPLY — 71 items
AID PSTN UNV HD RSTRNT DISP (MISCELLANEOUS)
ANCH SUT SWLK 19.1X4.75 VT (Anchor) ×2 IMPLANT
ANCHOR NDL 9/16 CIR SZ 8 (NEEDLE) IMPLANT
ANCHOR NEEDLE 9/16 CIR SZ 8 (NEEDLE) ×3 IMPLANT
ANCHOR PEEK 4.75X19.1 SWLK C (Anchor) ×4 IMPLANT
BLADE EXCALIBUR 4.0MM X 13CM (MISCELLANEOUS) ×1
BLADE EXCALIBUR 4.0X13 (MISCELLANEOUS) ×2 IMPLANT
BLADE SURG SZ11 CARB STEEL (BLADE) ×3 IMPLANT
CANNULA ACUFO 5X76 (CANNULA) ×3 IMPLANT
CLEANER TIP ELECTROSURG 2X2 (MISCELLANEOUS) IMPLANT
CLOSURE WOUND 1/2 X4 (GAUZE/BANDAGES/DRESSINGS)
COVER SURGICAL LIGHT HANDLE (MISCELLANEOUS) ×3 IMPLANT
COVER WAND RF STERILE (DRAPES) IMPLANT
DISSECTOR  3.8MM X 13CM (MISCELLANEOUS)
DISSECTOR 3.5MM X 13CM (MISCELLANEOUS) IMPLANT
DISSECTOR 3.8MM X 13CM (MISCELLANEOUS) IMPLANT
DRAPE POUCH INSTRU U-SHP 10X18 (DRAPES) ×3 IMPLANT
DRAPE STERI 35X30 U-POUCH (DRAPES) ×3 IMPLANT
DRSG AQUACEL AG ADV 3.5X 4 (GAUZE/BANDAGES/DRESSINGS) ×2 IMPLANT
DRSG AQUACEL AG ADV 3.5X 6 (GAUZE/BANDAGES/DRESSINGS) IMPLANT
DRSG PAD ABDOMINAL 8X10 ST (GAUZE/BANDAGES/DRESSINGS) IMPLANT
DURAPREP 26ML APPLICATOR (WOUND CARE) ×3 IMPLANT
ELECT NDL TIP 2.8 STRL (NEEDLE) IMPLANT
ELECT NEEDLE TIP 2.8 STRL (NEEDLE) IMPLANT
ELECT REM PT RETURN 15FT ADLT (MISCELLANEOUS) ×3 IMPLANT
FILTER STRAW (MISCELLANEOUS) ×3 IMPLANT
GLOVE BIOGEL PI IND STRL 7.5 (GLOVE) ×1 IMPLANT
GLOVE BIOGEL PI IND STRL 8 (GLOVE) ×1 IMPLANT
GLOVE BIOGEL PI INDICATOR 7.5 (GLOVE) ×2
GLOVE BIOGEL PI INDICATOR 8 (GLOVE) ×2
GLOVE SURG SS PI 7.5 STRL IVOR (GLOVE) ×6 IMPLANT
GLOVE SURG SS PI 8.0 STRL IVOR (GLOVE) ×6 IMPLANT
GOWN STRL REUS W/TWL XL LVL3 (GOWN DISPOSABLE) ×6 IMPLANT
KIT BASIN (CUSTOM PROCEDURE TRAY) ×3 IMPLANT
KIT BASIN OR (CUSTOM PROCEDURE TRAY) ×3 IMPLANT
KIT TURNOVER KIT A (KITS) IMPLANT
MANIFOLD NEPTUNE II (INSTRUMENTS) ×3 IMPLANT
NDL SCORPION MULTI FIRE (NEEDLE) IMPLANT
NDL SPNL 18GX3.5 QUINCKE PK (NEEDLE) ×1 IMPLANT
NEEDLE SCORPION MULTI FIRE (NEEDLE) ×3 IMPLANT
NEEDLE SPNL 18GX3.5 QUINCKE PK (NEEDLE) ×3 IMPLANT
PACK SHOULDER (CUSTOM PROCEDURE TRAY) ×3 IMPLANT
PENCIL SMOKE EVACUATOR (MISCELLANEOUS) IMPLANT
PORT APPOLLO RF 90DEGREE MULTI (SURGICAL WAND) IMPLANT
PROTECTOR NERVE ULNAR (MISCELLANEOUS) ×3 IMPLANT
RESTRAINT HEAD UNIVERSAL NS (MISCELLANEOUS) IMPLANT
SLING ARM IMMOBILIZER LRG (SOFTGOODS) IMPLANT
SLING ARM IMMOBILIZER MED (SOFTGOODS) IMPLANT
SLING ULTRA II L (ORTHOPEDIC SUPPLIES) IMPLANT
STRIP CLOSURE SKIN 1/2X4 (GAUZE/BANDAGES/DRESSINGS) IMPLANT
SUCTION FRAZIER HANDLE 12FR (TUBING) ×3
SUCTION TUBE FRAZIER 12FR DISP (TUBING) ×1 IMPLANT
SUT ETHIBOND NAB CT1 #1 30IN (SUTURE) IMPLANT
SUT ETHILON 4 0 PS 2 18 (SUTURE) IMPLANT
SUT FIBERWIRE #2 38 T-5 BLUE (SUTURE)
SUT PROLENE 3 0 PS 2 (SUTURE) ×2 IMPLANT
SUT TIGER TAPE 7 IN WHITE (SUTURE) IMPLANT
SUT VIC AB 0 CT1 27 (SUTURE) ×3
SUT VIC AB 0 CT1 27XBRD ANTBC (SUTURE) ×1 IMPLANT
SUT VIC AB 1-0 CT2 27 (SUTURE) IMPLANT
SUT VIC AB 2-0 CT2 27 (SUTURE) ×2 IMPLANT
SUT VICRYL 0 UR6 27IN ABS (SUTURE) ×2 IMPLANT
SUTURE FIBERWR #2 38 T-5 BLUE (SUTURE) IMPLANT
SYR 20ML LL LF (SYRINGE) ×3 IMPLANT
TAPE FIBER 2MM 7IN #2 BLUE (SUTURE) IMPLANT
TAPE STRIPS DRAPE STRL (GAUZE/BANDAGES/DRESSINGS) ×2 IMPLANT
TOWEL OR 17X26 10 PK STRL BLUE (TOWEL DISPOSABLE) ×3 IMPLANT
TUBING ARTHROSCOPY IRRIG 16FT (MISCELLANEOUS) ×3 IMPLANT
TUBING CONNECTING 10 (TUBING) IMPLANT
TUBING CONNECTING 10' (TUBING)
WIPE CHG CHLORHEXIDINE 2% (PERSONAL CARE ITEMS) ×3 IMPLANT

## 2020-03-22 NOTE — Anesthesia Postprocedure Evaluation (Signed)
Anesthesia Post Note  Patient: Timothy Gates  Procedure(Gates) Performed: RIGHT SHOULDER MINI OPEN ROTATOR CUFF REPAIR AND SUBACROMIAL DECOMPRESSION (Left )     Patient location during evaluation: PACU Anesthesia Type: General Level of consciousness: awake and alert Pain management: pain level controlled Vital Signs Assessment: post-procedure vital signs reviewed and stable Respiratory status: spontaneous breathing, nonlabored ventilation, respiratory function stable and patient connected to nasal cannula oxygen Cardiovascular status: blood pressure returned to baseline and stable Postop Assessment: no apparent nausea or vomiting Anesthetic complications: no    Last Vitals:  Vitals:   03/22/20 1024 03/22/20 1107  BP: (!) 151/70 140/73  Pulse:    Resp: 16 18  Temp: 37 C 36.9 C  SpO2: 96% 98%    Last Pain:  Vitals:   03/22/20 1107  TempSrc:   PainSc: 0-No pain                 Timothy Gates

## 2020-03-22 NOTE — Transfer of Care (Signed)
Immediate Anesthesia Transfer of Care Note  Patient: Timothy Gates  Procedure(s) Performed: Procedure(s) with comments: RIGHT SHOULDER MINI OPEN ROTATOR CUFF REPAIR AND SUBACROMIAL DECOMPRESSION (Left) - 90 mins  Patient Location: PACU  Anesthesia Type:General  Level of Consciousness:  sedated, patient cooperative and responds to stimulation  Airway & Oxygen Therapy:Patient Spontanous Breathing and Patient connected to face mask oxgen  Post-op Assessment:  Report given to PACU RN and Post -op Vital signs reviewed and stable  Post vital signs:  Reviewed and stable  Last Vitals:  Vitals:   03/22/20 0601  BP: 140/66  Pulse: 69  Resp: 18  Temp: 37 C  SpO2: [id number]    Complications: No apparent anesthesia complications

## 2020-03-22 NOTE — Brief Op Note (Signed)
03/22/2020  9:02 AM  PATIENT:  Timothy Gates  74 y.o. male  PRE-OPERATIVE DIAGNOSIS:  Left shoulder rotator cuff tear  POST-OPERATIVE DIAGNOSIS:  Left shoulder rotator cuff tear  PROCEDURE:  Procedure(s) with comments: RIGHT SHOULDER MINI OPEN ROTATOR CUFF REPAIR AND SUBACROMIAL DECOMPRESSION (Left) - 90 mins  SURGEON:  Surgeon(s) and Role:    Susa Day, MD - Primary  PHYSICIAN ASSISTANT:   ASSISTANTS: [place]   ANESTHESIA:   general  EBL:  50 mL   BLOOD ADMINISTERED:none  DRAINS: none   LOCAL MEDICATIONS USED:  MARCAINE     SPECIMEN:  No Specimen  DISPOSITION OF SPECIMEN:  N/A  COUNTS:  YES  TOURNIQUET:  * No tourniquets in log *  DICTATION: .Other Dictation: Dictation Number  [id number]  PLAN OF CARE: Discharge to home after PACU  PATIENT DISPOSITION:  PACU - hemodynamically stable.   Delay start of Pharmacological VTE agent (>24hrs) due to surgical blood loss or risk of bleeding: no

## 2020-03-22 NOTE — Op Note (Signed)
NAME: Timothy Gates, Timothy Gates MEDICAL RECORD [id number] ACCOUNT [id number] DATE OF BIRTH:16-Aug-1946 FACILITY: [facility] LOCATION: [facility] PHYSICIAN:Tadashi Burkel Windy Kalata, MD  OPERATIVE REPORT  DATE OF PROCEDURE:  03/22/2020  PREOPERATIVE DIAGNOSIS:  Rotator cuff tear, impingement syndrome, left shoulder.  POSTOPERATIVE DIAGNOSIS:  Rotator cuff tear, impingement syndrome, left shoulder.  PROCEDURE PERFORMED:  Mini open rotator cuff repair with subacromial decompression, acromioplasty.  ANESTHESIA:  General with regional block.  IMPLANTS:  2 SwiveLock suture anchors and TigerTape.  ASSISTANT:  Lacie Draft, PA  HISTORY:  This is a 74 year old male who recently fell onto his shoulder and had an acute retracted tear of the supraspinatus portion of the rotator cuff.  He was indicated for a mini open rotator cuff repair.  We discussed the risks and benefits  including bleeding, infection, damage to neurovascular structures, no change in symptoms, worsening symptoms, DVT, PE, anesthetic complications, etc.  TECHNIQUE:  With the patient in supine chair position after induction of adequate general anesthesia after regional block, left upper extremity was prepped and draped in the usual sterile fashion.  A surgical marker was utilized to delineate the anterior  and lateral aspect of the acromion.  I made a 3 cm incision over that acromion.  Subcutaneous tissue was dissected.  Electrocautery was utilized to achieve hemostasis.  Raphe between anterolateral heads was identified.  I infiltrated the deltoid with  0.25% Marcaine with epinephrine.  I divided the rhaphe in line with the skin incision.  A self-retaining retractor was then placed.  We then entered the subacromial space.  Hypertrophic bursa was noted and excised.  Spur off the anterolateral aspect of  the acromion was removed with a 3 mm Kerrison.  I digitally lysed subacromial adhesions.  Next, noted was a retraction of the supraspinatus.  There  was partial tearing of the infraspinatus and a portion of the subscap that was confluent with the proximal  transverse humeral ligament, was torn exposing the proximal end of the biceps.  It was in its groove, however, and intact.  After copious irrigation of the glenohumeral joint, I mobilized the torn supraspinatus with an Allis on its bursal and articular  side, advanced approximately 1.5 cm just past the articular surface.  After full mobilization, there was some portion of the subscap anteriorly that was shredded.  I therefore proceeded with repairing the intact portion.  I placed a SwiveLock suture  anchor just lateral to the articular surface into the humeral head after preparing a trough with a bare rongeur to good bleeding tissue.  I then inserted a SwiveLock with a TigerTape within that, inserted and excellent resistance to pullout.  I threaded  2 threads of that through the subscap and infraspinatus portion that was intact.  Advancing it to the trough with the arm at its side.  I then placed it in a 2nd row SwiveLock after placement of a pilot hole in the greater tuberosity.  Inserted the  [employer].  Excellent resistance to pullout was noted.  With the arm at the side without undue tension this was secured in the SwiveLock.  Excellent resistance to pullout.  Redundant suture removed.  The rescue suture from here was utilized to repair the  transverse humeral ligament and that lateral aspect of the subscap over the proximal bicipital groove.  This tissue was though attenuated.  This was secured utilizing a free needle.  Copiously irrigated the wound.  There was a portion of a gap just  anterior to the supraspinatus between that and the subscap  though that was uncovered.  I felt that we achieved some attachment of the portion of the supraspinatus and infraspinatus to the lateral aspect of the bed.  Copiously irrigated the wound.   Cautery was utilized to achieve strict hemostasis.  I then again  reexamined and felt there was no additional subscap that was amenable to repair  and this area was not amenable to patching either.  I then removed the retractor.  We repaired the dorsal  lumbar fascia with 0 Vicryl interrupted figure-of-eight sutures, subcu with 2-0 and skin with Prolene.  Sterile dressing applied, placed in an abduction pillow, extubated and transported to the recovery room in satisfactory condition.  The patient tolerated the procedure well.  No complications.  Minimal blood loss.  CN/NUANCE  D:03/22/2020 T:03/22/2020 JOB:[id number]

## 2020-03-22 NOTE — Anesthesia Preprocedure Evaluation (Signed)
Anesthesia Evaluation  Patient identified by MRN, date of birth, ID band Patient awake    Reviewed: Allergy & Precautions, NPO status , Patient's Chart, lab work & pertinent test results  Airway Mallampati: II  TM Distance: >3 FB Neck ROM: Full    Dental no notable dental hx.    Pulmonary sleep apnea , former smoker,    Pulmonary exam normal breath sounds clear to auscultation       Cardiovascular + CAD and + CABG  Normal cardiovascular exam Rhythm:Regular Rate:Normal     Neuro/Psych negative neurological ROS  negative psych ROS   GI/Hepatic negative GI ROS, Neg liver ROS,   Endo/Other  negative endocrine ROS  Renal/GU negative Renal ROS  negative genitourinary   Musculoskeletal negative musculoskeletal ROS (+)   Abdominal   Peds negative pediatric ROS (+)  Hematology negative hematology ROS (+)   Anesthesia Other Findings   Reproductive/Obstetrics negative OB ROS                             Anesthesia Physical Anesthesia Plan  ASA: III  Anesthesia Plan: General   Post-op Pain Management:  Regional for Post-op pain   Induction: Intravenous  PONV Risk Score and Plan: 2 and Ondansetron, Dexamethasone and Treatment may vary due to age or medical condition  Airway Management Planned: Oral ETT  Additional Equipment:   Intra-op Plan:   Post-operative Plan: Extubation in OR  Informed Consent: I have reviewed the patients History and Physical, chart, labs and discussed the procedure including the risks, benefits and alternatives for the proposed anesthesia with the patient or authorized representative who has indicated his/her understanding and acceptance.     Dental advisory given  Plan Discussed with: CRNA and Surgeon  Anesthesia Plan Comments:         Anesthesia Quick Evaluation

## 2020-03-22 NOTE — Anesthesia Procedure Notes (Signed)
Anesthesia Regional Block: Interscalene brachial plexus block   Pre-Anesthetic Checklist: ,, timeout performed, Correct Patient, Correct Site, Correct Laterality, Correct Procedure, Correct Position, site marked, Risks and benefits discussed,  Surgical consent,  Pre-op evaluation,  At surgeon's request and post-op pain management  Laterality: Left  Prep: chloraprep       Needles:  Injection technique: Single-shot  Needle Type: Echogenic Needle     Needle Length: 9cm      Additional Needles:   Procedures:,,,, ultrasound used (permanent image in chart),,,,  Narrative:  Start time: 03/22/2020 7:16 AM End time: 03/22/2020 7:24 AM Injection made incrementally with aspirations every 5 mL.  Performed by: Personally  Anesthesiologist: Myrtie Soman, MD  Additional Notes: Patient tolerated the procedure well without complications

## 2020-03-22 NOTE — Anesthesia Procedure Notes (Signed)
Procedure Name: Intubation Date/Time: 03/22/2020 7:58 AM Performed by: Lavina Hamman, CRNA Pre-anesthesia Checklist: Patient identified, Emergency Drugs available, Suction available, Patient being monitored and Timeout performed Patient Re-evaluated:Patient Re-evaluated prior to induction Oxygen Delivery Method: Circle system utilized Preoxygenation: Pre-oxygenation with 100% oxygen Induction Type: IV induction Ventilation: Mask ventilation without difficulty Laryngoscope Size: Mac and 3 Grade View: Grade I Tube type: Oral Tube size: 7.5 mm Number of attempts: 1 Airway Equipment and Method: Stylet Placement Confirmation: ETT inserted through vocal cords under direct vision,  positive ETCO2,  CO2 detector and breath sounds checked- equal and bilateral Secured at: 22 cm Tube secured with: Tape Dental Injury: Teeth and Oropharynx as per pre-operative assessment

## 2020-03-22 NOTE — Discharge Instructions (Signed)
Aquacel dressing may remain in place until follow up. May shower with aquacel dressing in place. If the dressing becomes saturated or peels off, you may remove it and place a new dressing with gauze and tape which should be kept clean and dry and changed daily. Use sling at times except when exercising or showering No driving for 4-6 weeks No lifting for 6 weeks operative arm Pendulum exercises as instructed. Ok to move wrist, elbow, and hand. See Dr. Tonita Cong in 10-14 days. Take one aspirin per day with a meal if not on a blood thinner or allergic to aspirin.  Pendulum Circular    Bend forward 90 at waist, leaning on table for support. Rock body in a circular pattern to move arm clockwise __5__ times then counterclockwise __5__ times. Do __2__ sessions per day.  Copyright  VHI. All rights reserved.     General Anesthesia, Adult, Care After This sheet gives you information about how to care for yourself after your procedure. Your health care provider may also give you more specific instructions. If you have problems or questions, contact your health care provider. What can I expect after the procedure? After the procedure, the following side effects are common:  Pain or discomfort at the IV site.  Nausea.  Vomiting.  Sore throat.  Trouble concentrating.  Feeling cold or chills.  Weak or tired.  Sleepiness and fatigue.  Soreness and body aches. These side effects can affect parts of the body that were not involved in surgery. Follow these instructions at home:  For at least 24 hours after the procedure:  Have a responsible adult stay with you. It is important to have someone help care for you until you are awake and alert.  Rest as needed.  Do not: ? Participate in activities in which you could fall or become injured. ? Drive. ? Use heavy machinery. ? Drink alcohol. ? Take sleeping pills or medicines that cause drowsiness. ? Make important decisions or sign legal  documents. ? Take care of children on your own. Eating and drinking  Follow any instructions from your health care provider about eating or drinking restrictions.  When you feel hungry, start by eating small amounts of foods that are soft and easy to digest (bland), such as toast. Gradually return to your regular diet.  Drink enough fluid to keep your urine pale yellow.  If you vomit, rehydrate by drinking water, juice, or clear broth. General instructions  If you have sleep apnea, surgery and certain medicines can increase your risk for breathing problems. Follow instructions from your health care provider about wearing your sleep device: ? Anytime you are sleeping, including during daytime naps. ? While taking prescription pain medicines, sleeping medicines, or medicines that make you drowsy.  Return to your normal activities as told by your health care provider. Ask your health care provider what activities are safe for you.  Take over-the-counter and prescription medicines only as told by your health care provider.  If you smoke, do not smoke without supervision.  Keep all follow-up visits as told by your health care provider. This is important. Contact a health care provider if:  You have nausea or vomiting that does not get better with medicine.  You cannot eat or drink without vomiting.  You have pain that does not get better with medicine.  You are unable to pass urine.  You develop a skin rash.  You have a fever.  You have redness around your IV site that  gets worse. Get help right away if:  You have difficulty breathing.  You have chest pain.  You have blood in your urine or stool, or you vomit blood. Summary  After the procedure, it is common to have a sore throat or nausea. It is also common to feel tired.  Have a responsible adult stay with you for the first 24 hours after general anesthesia. It is important to have someone help care for you until you  are awake and alert.  When you feel hungry, start by eating small amounts of foods that are soft and easy to digest (bland), such as toast. Gradually return to your regular diet.  Drink enough fluid to keep your urine pale yellow.  Return to your normal activities as told by your health care provider. Ask your health care provider what activities are safe for you. This information is not intended to replace advice given to you by your health care provider. Make sure you discuss any questions you have with your health care provider. Document Revised: 11/14/2017 Document Reviewed: 06/27/2017 Elsevier Patient Education  [place].

## 2020-03-22 NOTE — Interval H&P Note (Signed)
History and Physical Interval Note:  03/22/2020 7:32 AM  Timothy Gates  has presented today for surgery, with the diagnosis of Left shoulder rotator cuff tear.  The various methods of treatment have been discussed with the patient and family. After consideration of risks, benefits and other options for treatment, the patient has consented to  Procedure(s) with comments: SHOULDER ARTHROSCOPY WITH MINI OPEN ROTATOR CUFF REPAIR AND SUBACROMIAL DECOMPRESSION (Left) - 90 mins as a surgical intervention.  The patient's history has been reviewed, patient examined, no change in status, stable for surgery.  I have reviewed the patient's chart and labs.  Questions were answered to the patient's satisfaction.     Johnn Hai

## 2020-03-22 NOTE — Anesthesia Procedure Notes (Signed)
Anesthesia Procedure Image    

## 2020-03-23 ENCOUNTER — Encounter: Payer: Self-pay | Admitting: *Deleted

## 2020-05-07 IMAGING — CT CT HEAD W/O CM
3 of 4 series · 15 of 47 positions shown, 18 images · non-contrast
Comparison: August 07, 2007

CLINICAL DATA: Headache and blurred vision

EXAM:
CT HEAD WITHOUT CONTRAST
TECHNIQUE: Contiguous axial images were obtained from the base of the skull
through the vertex without intravenous contrast.

[Series 2: head 5.00 hr40 s3 ibhc · axial · 0.47mm/px · z∈[-555,-415]mm · 9 of 34 slices shown, 12 images]
[im 3/34  brain]
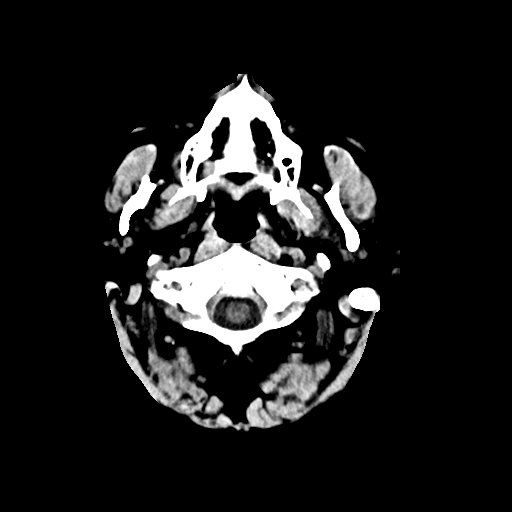
[im 3/34  bone]
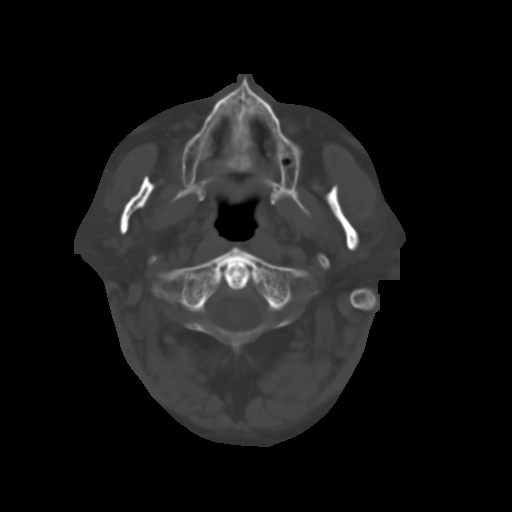
[im 8/34  brain]
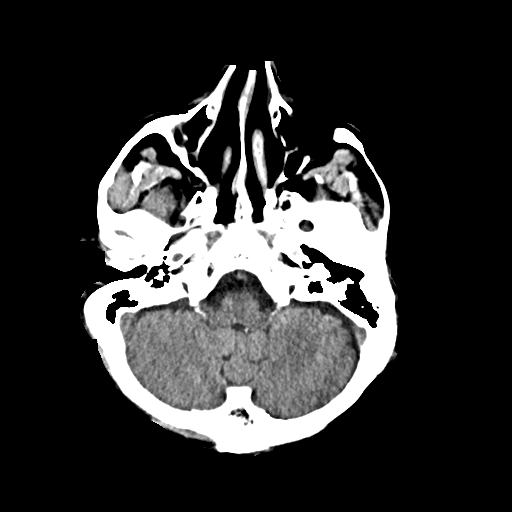
[im 10/34  brain]
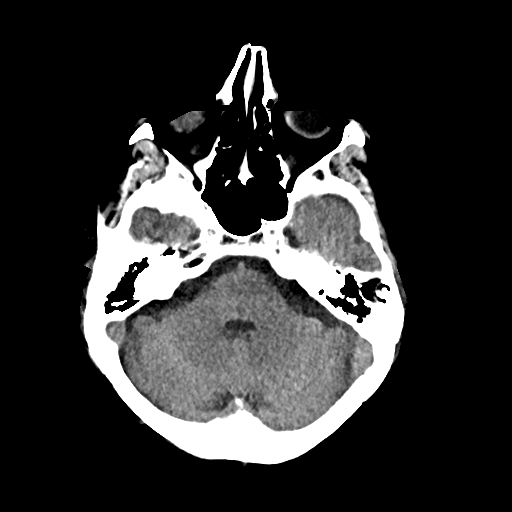
[im 15/34  brain]
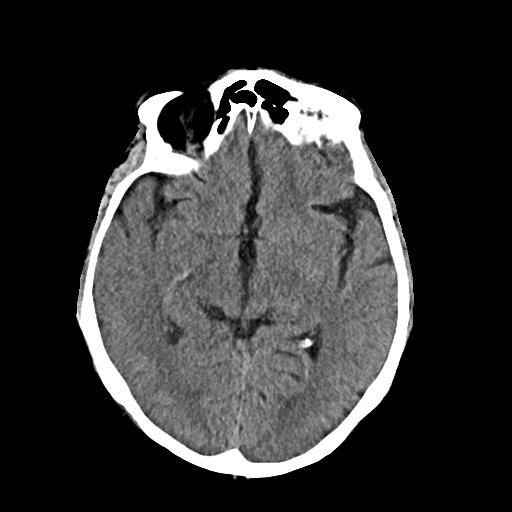
[im 17/34  brain]
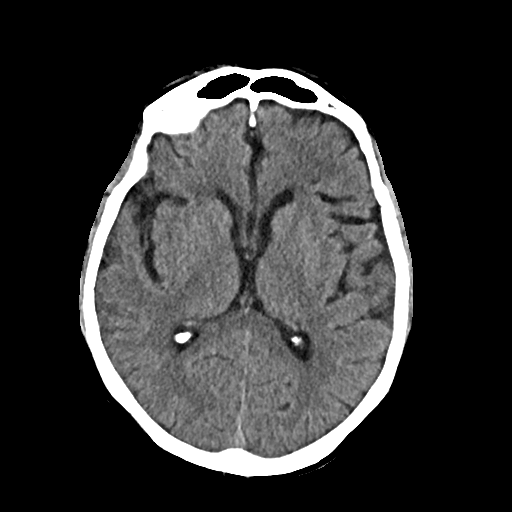
[im 17/34  bone]
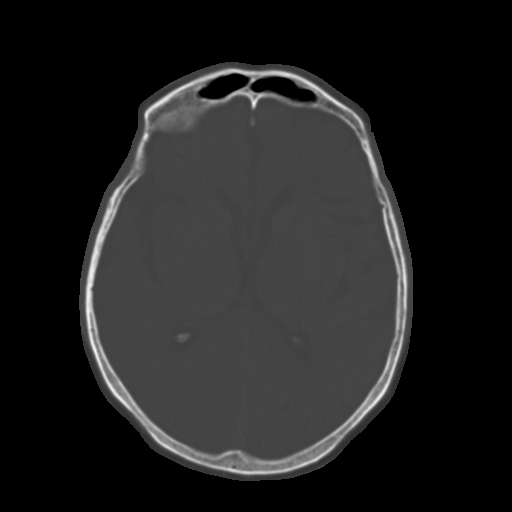
[im 19/34  brain]
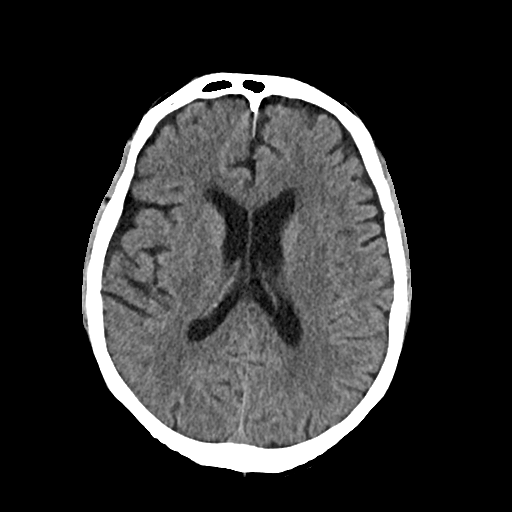
[im 24/34  brain]
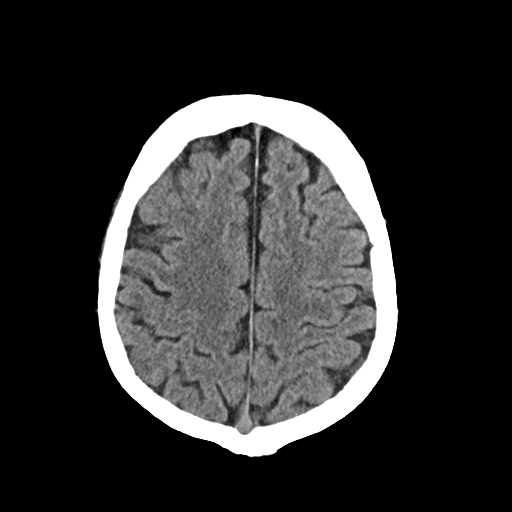
[im 26/34  brain]
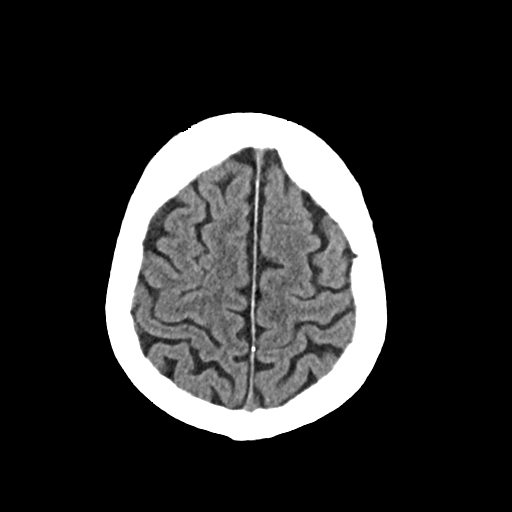
[im 31/34  brain]
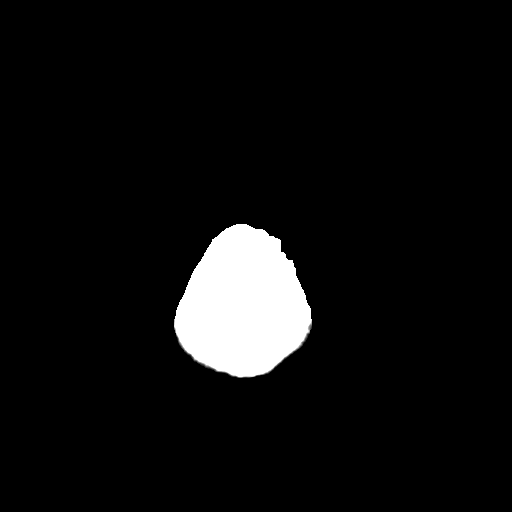
[im 31/34  bone]
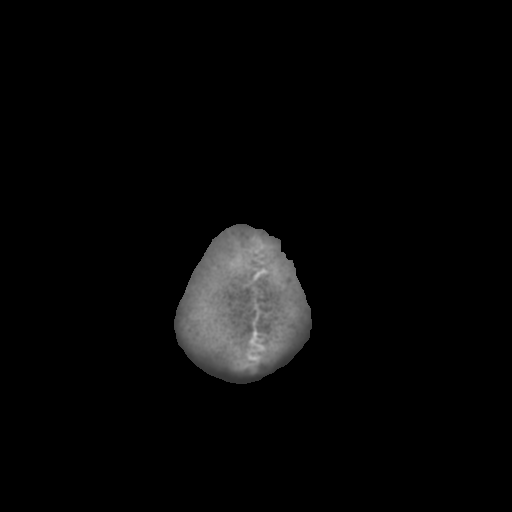

[Series 4: head 3.00 hr40 s3 sag · sagittal · 0.31mm/px · 3 of 65 slices shown]
[im 22/65  brain]
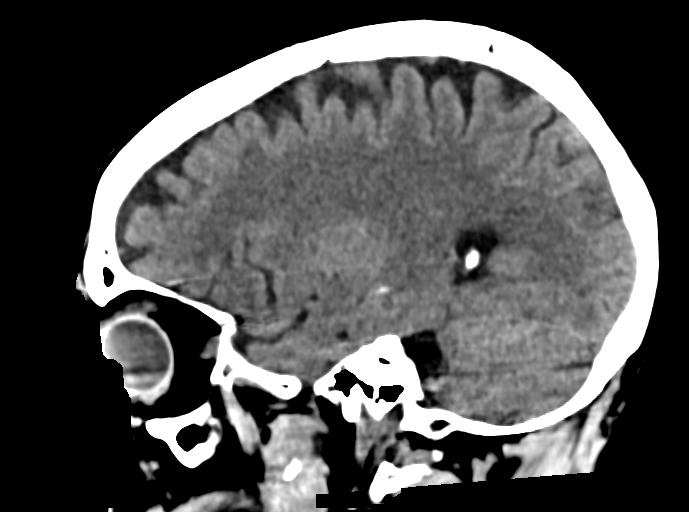
[im 33/65  brain]
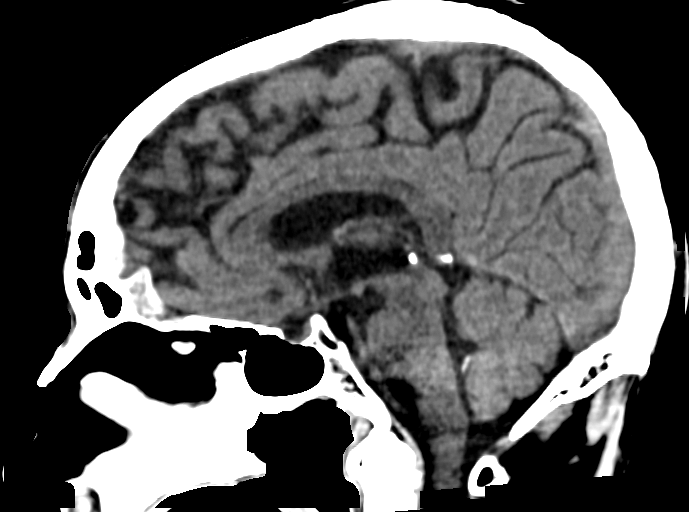
[im 43/65  brain]
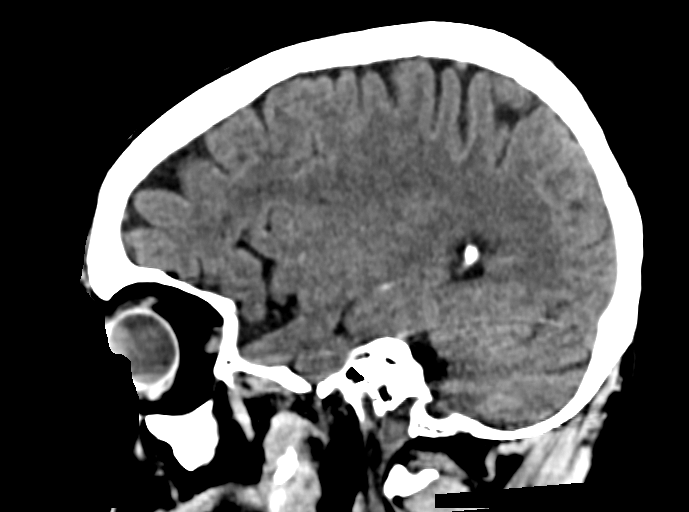

[Series 6: head 3.00 hr40 s3 cor · coronal · 0.33mm/px · 3 of 75 slices shown]
[im 25/75  brain]
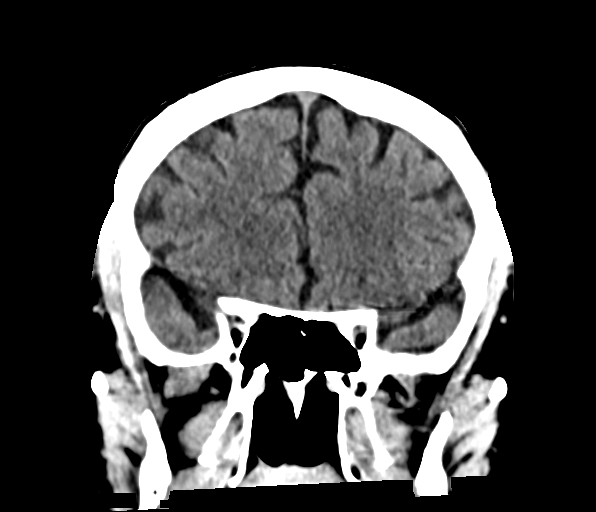
[im 33/75  brain]
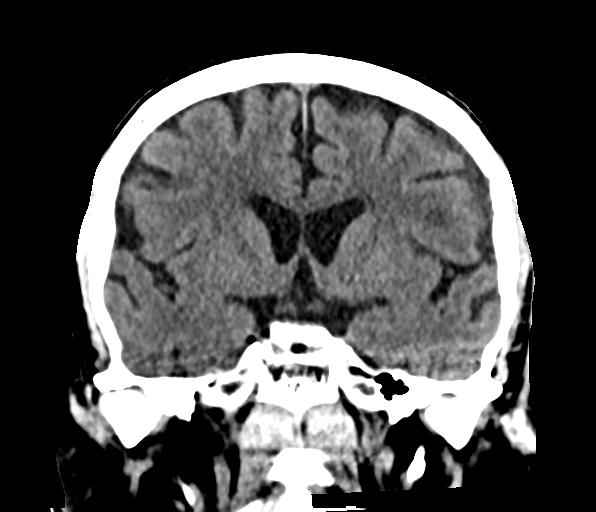
[im 42/75  brain]
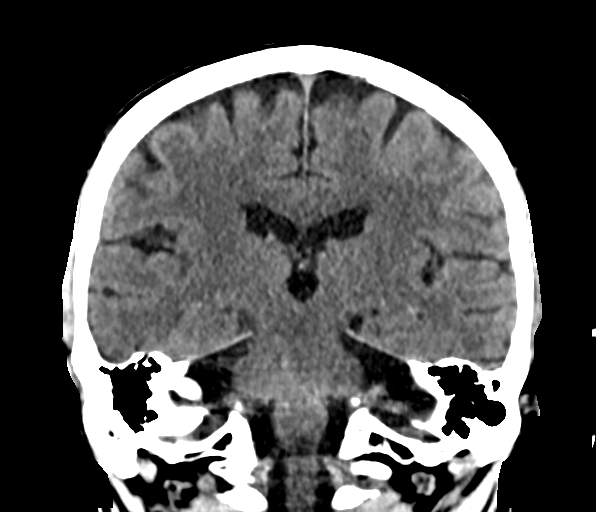

[15 of 47 positions shown; findings below may reference images not displayed]

FINDINGS: Brain: There is age related volume loss, stable. There is no
intracranial mass, hemorrhage, extra-axial fluid collection, or
midline shift. There is mild small vessel disease in the centra
semiovale bilaterally. Elsewhere brain parenchyma appears
unremarkable. No evident acute infarct.

Vascular: No hyperdense vessel. There is calcification in each
carotid siphon region.

Skull: The bony calvarium appears intact.

Sinuses/Orbits: There is mucosal thickening in the right maxillary
antrum. There is mucosal thickening in several ethmoid air cells
bilaterally. Other paranasal sinuses are clear. Orbits appear
symmetric bilaterally.

Other: Mastoid air cells are clear.
IMPRESSION: Age related volume loss with mild periventricular small vessel
disease. No acute infarct. No mass or hemorrhage.

There are foci of arterial vascular calcification. There are areas
of paranasal sinus disease.

## 2020-12-04 DIAGNOSIS — Z23 Encounter for immunization: Secondary | ICD-10-CM | POA: Diagnosis not present

## 2020-12-05 ENCOUNTER — Other Ambulatory Visit: Payer: Self-pay

## 2020-12-05 ENCOUNTER — Ambulatory Visit (INDEPENDENT_AMBULATORY_CARE_PROVIDER_SITE_OTHER): Payer: Medicare Other | Admitting: Cardiovascular Disease

## 2020-12-05 ENCOUNTER — Encounter: Payer: Self-pay | Admitting: Cardiovascular Disease

## 2020-12-05 VITALS — BP 140/68 | HR 70 | Ht 66.0 in | Wt 163.8 lb

## 2020-12-05 DIAGNOSIS — I25118 Atherosclerotic heart disease of native coronary artery with other forms of angina pectoris: Secondary | ICD-10-CM | POA: Diagnosis not present

## 2020-12-05 DIAGNOSIS — E782 Mixed hyperlipidemia: Secondary | ICD-10-CM

## 2020-12-05 DIAGNOSIS — E785 Hyperlipidemia, unspecified: Secondary | ICD-10-CM

## 2020-12-05 DIAGNOSIS — M6281 Muscle weakness (generalized): Secondary | ICD-10-CM

## 2020-12-05 DIAGNOSIS — G4733 Obstructive sleep apnea (adult) (pediatric): Secondary | ICD-10-CM | POA: Diagnosis not present

## 2020-12-05 DIAGNOSIS — Z9989 Dependence on other enabling machines and devices: Secondary | ICD-10-CM

## 2020-12-05 DIAGNOSIS — I4510 Unspecified right bundle-branch block: Secondary | ICD-10-CM

## 2020-12-05 NOTE — Patient Instructions (Signed)
Medication Instructions:  HOLD the Pravastatin until further notice  *If you need a refill on your cardiac medications before your next appointment, please call your pharmacy*   Lab Work: Your provider would like for you to have the following labs today: Creatinine Kinase and TSH  If you have labs (blood work) drawn today and your tests are completely normal, you will receive your results only by: Marland Kitchen MyChart Message (if you have MyChart) OR . A paper copy in the mail If you have any lab test that is abnormal or we need to change your treatment, we will call you to review the results.   Testing/Procedures: None ordered   Follow-Up: At [facility], you and your health needs are our priority.  As part of our continuing mission to provide you with exceptional heart care, we have created designated Provider Care Teams.  These Care Teams include your primary Cardiologist (physician) and Advanced Practice Providers (APPs -  Physician Assistants and Nurse Practitioners) who all work together to provide you with the care you need, when you need it.  We recommend signing up for the patient portal called "MyChart".  Sign up information is provided on this After Visit Summary.  MyChart is used to connect with patients for Virtual Visits (Telemedicine).  Patients are able to view lab/test results, encounter notes, upcoming appointments, etc.  Non-urgent messages can be sent to your provider as well.   To learn more about what you can do with MyChart, go to [email or web address].    Your next appointment:   2 month(s)  The format for your next appointment:   In Person  Provider:   You may see Sanda Klein, MD or one of the following Advanced Practice Providers on your designated Care Team:    Almyra Deforest, PA-C  Fabian Sharp, PA-C or   Roby Lofts, [place]

## 2020-12-05 NOTE — Progress Notes (Signed)
.    Cardiology Office Note    Date:  12/05/2020   ID:  Timothy Gates, DOB Sep 11, 1946, MRN [id number]  PCP:  Deland Pretty, MD  Cardiologist:  Cindie Crumbly, MD   No chief complaint on file.   History of Present Illness:  Timothy Gates is a 75 y.o. male with history of coronary artery disease and bypass surgery with all arterial conduits (sequential LIMA to LAD, Dr. Roxan Hockey, 2000), obstructive sleep apnea and hyperlipidemia returning for routine follow-up.  It has been a difficult year emotionally, since he's lost 3 of his brothers this year. He has not had any cardiovascular complaints. The patient specifically denies any chest pain at rest exertion, dyspnea at rest or with exertion, orthopnea, paroxysmal nocturnal dyspnea, syncope, palpitations, focal neurological deficits, intermittent claudication, lower extremity edema, unexplained weight gain, cough, hemoptysis or wheezing. He denies daytime hypersomnolence.  His activity level has decreased. He has noticed substantial problems with weakness in his leg muscles, particularly in his thighs. He has to push himself up out of a chair using his arms. Sometimes he has a lot of difficulty getting in and out of bed, and has an adjustable bed to help. He has not had any problems with fine motor strength or diplopia. He has not had any muscle pain or skin rashes. He has gained about 4 pounds over the last year. He does not have a known adrenal or thyroid disease. He takes a vitamin D supplement and a vitamin B12 supplement. He is no longer taking iron to this cause constipation. He was told that he has "borderline anemia" at his last appointment with his primary care provider.  Recent labs show a pattern similar to the past with low HDL cholesterol mild hypertriglyceridemia, but despite continuing treatment with pravastatin his LDL is higher than usual at 107.  Past Medical History:  Diagnosis Date  . Arthritis    lower back  .  CAD (coronary artery disease)   . GERD (gastroesophageal reflux disease)   . Hyperlipidemia   . Iron deficiency anemia   . OSA (obstructive sleep apnea)   . RBBB (right bundle branch block) 10/28/2019   Noted on EKG  . Tinnitus    Left ear    Past Surgical History:  Procedure Laterality Date  . CORONARY ARTERY BYPASS GRAFT  04/02/1999  . DG BARIUM SWALLOW ([place] HX)    . FISSURECTOMY  2006  . SHOULDER ARTHROSCOPY WITH ROTATOR CUFF REPAIR AND SUBACROMIAL DECOMPRESSION Left 03/22/2020   Procedure: RIGHT SHOULDER MINI OPEN ROTATOR CUFF REPAIR AND SUBACROMIAL DECOMPRESSION;  Surgeon: Susa Day, MD;  Location: [facility];  Service: Orthopedics;  Laterality: Left;  90 mins  . SIGMOIDOSCOPY    . UPPER GI ENDOSCOPY      Current Medications: Outpatient Medications Prior to Visit  Medication Sig Dispense Refill  . aspirin 81 MG chewable tablet Chew 81 mg by mouth daily.    . Cholecalciferol (VITAMIN D3) 1000 units CAPS Take 1,000 Units by mouth daily.    . finasteride (PROSCAR) 5 MG tablet Take 5 mg by mouth daily.    . meclizine (ANTIVERT) 25 MG tablet one tablet    . metoprolol succinate (TOPROL-XL) 50 MG 24 hr tablet Take 25 mg by mouth daily after supper. Take with or immediately following a meal.    . pantoprazole (PROTONIX) 40 MG tablet Take 40 mg by mouth daily.    . pravastatin (PRAVACHOL) 40 MG tablet TAKE 1 TABLET (40 MG TOTAL)  BY MOUTH EVERY EVENING. 30 tablet 6  . sertraline (ZOLOFT) 50 MG tablet Take 50 mg by mouth daily.    . vitamin B-12 (CYANOCOBALAMIN) 1000 MCG tablet Take 1,000 mcg by mouth daily.    Marland Kitchen zolpidem (AMBIEN) 10 MG tablet Take 10 mg by mouth at bedtime.    . Lidocaine-Glycerin (PREPARATION H EX) Apply 1 application topically daily as needed (irritation).    . methocarbamol (ROBAXIN) 500 MG tablet Take 1 tablet (500 mg total) by mouth every 8 (eight) hours as needed for muscle spasms. 20 tablet 1  . oxyCODONE-acetaminophen (PERCOCET) 5-325 MG tablet Take 1 tablet  by mouth every 4 (four) hours as needed for severe pain. 40 tablet 0  . Pediatric Multivitamins-Iron (CHILDRENS MULTIVITAMIN/IRON PO) Take 1 tablet by mouth daily. Flintstones     No facility-administered medications prior to visit.     Allergies:   Benadryl [diphenhydramine hcl]   Social History   Socioeconomic History  . Marital status: Married    Spouse name: Not on file  . Number of children: Not on file  . Years of education: Not on file  . Highest education level: Not on file  Occupational History  . Not on file  Tobacco Use  . Smoking status: Former Smoker    Types: Cigarettes    Quit date: 1993    Years since quitting: 29.0  . Smokeless tobacco: Former Systems developer    Quit date: 11/24/1992  Vaping Use  . Vaping Use: Never used  Substance and Sexual Activity  . Alcohol use: No  . Drug use: No  . Sexual activity: Not on file  Other Topics Concern  . Not on file  Social History Narrative  . Not on file   Social Determinants of Health   Financial Resource Strain: Not on file  Food Insecurity: Not on file  Transportation Needs: Not on file  Physical Activity: Not on file  Stress: Not on file  Social Connections: Not on file     Family History:  The patient's family history includes Cancer in his brother, father, maternal grandfather, maternal grandmother, sister, and sister; Diabetes in his mother; Heart attack in his paternal grandfather and paternal grandmother.   ROS:   Please see the history of present illness.    ROS all other systems are reviewed and are negative   PHYSICAL EXAM:   VS:  BP 140/68   Pulse 70   Ht 5\' 6"  (1.676 m)   Wt 163 lb 12.8 oz (74.3 kg)   SpO2 97%   BMI 26.44 kg/m    Recheck BP 132/70  General: Alert, oriented x3, no distress, appears well Head: no evidence of trauma, PERRL, EOMI, no exophtalmos or lid lag, no myxedema, no xanthelasma; normal ears, nose and oropharynx Neck: normal jugular venous pulsations and no hepatojugular  reflux; brisk carotid pulses without delay and no carotid bruits Chest: clear to auscultation, no signs of consolidation by percussion or palpation, normal fremitus, symmetrical and full respiratory excursions Cardiovascular: normal position and quality of the apical impulse, regular rhythm, normal first and widely split second heart sounds, no murmurs, rubs or gallops Abdomen: no tenderness or distention, no masses by palpation, no abnormal pulsatility or arterial bruits, normal bowel sounds, no hepatosplenomegaly Extremities: no clubbing, cyanosis or edema; 2+ radial, ulnar and brachial pulses bilaterally; 2+ right femoral, posterior tibial and dorsalis pedis pulses; 2+ left femoral, posterior tibial and dorsalis pedis pulses; no subclavian or femoral bruits Neurological: grossly nonfocal. May have some  wasting of his thigh muscles. Psych: Normal mood and affect   Wt Readings from Last 3 Encounters:  12/05/20 163 lb 12.8 oz (74.3 kg)  03/22/20 161 lb 1 oz (73.1 kg)  03/13/20 161 lb 1 oz (73.1 kg)     Studies/Labs Reviewed:   EKG:  EKG is ordered today. This shows sinus rhythm, old right bundle branch block, rightward axis without full-blown posterior fascicular block, no repolarization abnormalities. Recent Labs: 10/04/2019 Normal liver function tests, creatinine 0.9, glucose 105, potassium 4.8, hemoglobin 12.1 with normocytic indices, platelets 133k  Lipid Panel 10/04/2019 total cholesterol 158, triglycerides 117, HDL 29, LDL 92 03/30/2019 total cholesterol 144, triglycerides 232, HDL 27, LDL 71 10/09/2020 total cholesterol 167, triglycerides 155, HDL 32, LDL 107   ASSESSMENT:    1. Coronary artery disease of native artery of native heart with stable angina pectoris ([place])   2. Mixed hyperlipidemia   3. Dyslipidemia (high LDL; low HDL)   4. OSA on CPAP   5. RBBB   6. Proximal limb muscle weakness      PLAN:  In order of problems listed above:  1. CAD s/p CABG:  Asymptomatic. Continue aspirin. Hold statin until we clarify his muscle issues. 2. HLP: His LDL cholesterol is higher than usual and as always he has a low HDL cholesterol. May need a more potent statin, but first wants to clarify whether or not he is got any evidence of myopathy. 3. OSA: No longer using CPAP after weight loss. He does not have any symptoms of daytime hypersomnolence. 4. 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  He does not have any symptoms to suggest episodes of high-grade AV block. 5. Muscle weakness: This appears to be particular prominent in his thigh muscles in a pattern of proximal myopathy. We will have him hold his statin for at least a month. Also check for hypothyroidism. He does not have any clinical findings to suggest Cushing's syndrome. Eaton-Lambert syndrome or polymyositis should be considered if his symptoms do not improve after stopping the statin.   Medication Adjustments/Labs and Tests Ordered: Current medicines are reviewed at length with the patient today.  Concerns regarding medicines are outlined above.  Medication changes, Labs and Tests ordered today are listed in the Patient Instructions below. Patient Instructions  Medication Instructions:  HOLD the Pravastatin until further notice  *If you need a refill on your cardiac medications before your next appointment, please call your pharmacy*   Lab Work: Your provider would like for you to have the following labs today: Creatinine Kinase and TSH  If you have labs (blood work) drawn today and your tests are completely normal, you will receive your results only by: Marland Kitchen MyChart Message (if you have MyChart) OR . A paper copy in the mail If you have any lab test that is abnormal or we need to change your treatment, we will call you to review the results.   Testing/Procedures: None ordered   Follow-Up: At [facility], you and your health needs are  our priority.  As part of our continuing mission to provide you with exceptional heart care, we have created designated Provider Care Teams.  These Care Teams include your primary Cardiologist (physician) and Advanced Practice Providers (APPs -  Physician Assistants and Nurse Practitioners) who all work together to provide you with the care you need, when you need it.  We recommend signing up for the patient portal called "MyChart".  Sign up  information is provided on this After Visit Summary.  MyChart is used to connect with patients for Virtual Visits (Telemedicine).  Patients are able to view lab/test results, encounter notes, upcoming appointments, etc.  Non-urgent messages can be sent to your provider as well.   To learn more about what you can do with MyChart, go to [email or web address].    Your next appointment:   2 month(s)  The format for your next appointment:   In Person  Provider:   You may see Sanda Klein, MD or one of the following Advanced Practice Providers on your designated Care Team:    Almyra Deforest, PA-C  Fabian Sharp, [place] or   Roby Lofts, PA-C        Signed, Sanda Klein, MD  12/05/2020 2:34 PM    [address], [place], [place]  [address] Phone: [phone]; Fax: [phone]

## 2020-12-06 LAB — CK: Total CK: 564 U/L (ref 41–331)

## 2020-12-06 LAB — TSH: TSH: 2.32 u[IU]/mL (ref 0.450–4.500)

## 2021-03-02 ENCOUNTER — Encounter: Payer: Self-pay | Admitting: Cardiovascular Disease

## 2021-03-02 ENCOUNTER — Other Ambulatory Visit: Payer: Self-pay

## 2021-03-02 ENCOUNTER — Ambulatory Visit (INDEPENDENT_AMBULATORY_CARE_PROVIDER_SITE_OTHER): Payer: Medicare Other | Admitting: Cardiovascular Disease

## 2021-03-02 VITALS — BP 126/52 | HR 66 | Ht 66.0 in | Wt 160.0 lb

## 2021-03-02 DIAGNOSIS — E782 Mixed hyperlipidemia: Secondary | ICD-10-CM | POA: Diagnosis not present

## 2021-03-02 DIAGNOSIS — I2510 Atherosclerotic heart disease of native coronary artery without angina pectoris: Secondary | ICD-10-CM

## 2021-03-02 DIAGNOSIS — I452 Bifascicular block: Secondary | ICD-10-CM | POA: Diagnosis not present

## 2021-03-02 DIAGNOSIS — E785 Hyperlipidemia, unspecified: Secondary | ICD-10-CM | POA: Diagnosis not present

## 2021-03-02 DIAGNOSIS — Z8669 Personal history of other diseases of the nervous system and sense organs: Secondary | ICD-10-CM | POA: Diagnosis not present

## 2021-03-02 DIAGNOSIS — M6281 Muscle weakness (generalized): Secondary | ICD-10-CM

## 2021-03-02 MED ORDER — ROSUVASTATIN CALCIUM 20 MG PO TABS
20.0000 mg | ORAL_TABLET | Freq: Every day | ORAL | 3 refills | Status: DC
Start: 2021-03-02 — End: 2021-11-27

## 2021-03-02 NOTE — Patient Instructions (Signed)
Medication Instructions:  STOP the Pravastatin START Rosuvastatin 20 mg once daily  *If you need a refill on your cardiac medications before your next appointment, please call your pharmacy*   Lab Work: Your provider would like for you to return in 3 months to have the following labs drawn: fasting lipid and CK. You do not need an appointment for the lab. Once in our office lobby there is a podium where you can sign in and ring the doorbell to alert [place] that you are here. The lab is open from 8:00 am to 4:30 pm; closed for lunch from 12:45pm-1:45pm.  If you have labs (blood work) drawn today and your tests are completely normal, you will receive your results only by: Marland Kitchen MyChart Message (if you have MyChart) OR . A paper copy in the mail If you have any lab test that is abnormal or we need to change your treatment, we will call you to review the results.   Testing/Procedures: None ordered   Follow-Up: At [facility], you and your health needs are our priority.  As part of our continuing mission to provide you with exceptional heart care, we have created designated Provider Care Teams.  These Care Teams include your primary Cardiologist (physician) and Advanced Practice Providers (APPs -  Physician Assistants and Nurse Practitioners) who all work together to provide you with the care you need, when you need it.  We recommend signing up for the patient portal called "MyChart".  Sign up information is provided on this After Visit Summary.  MyChart is used to connect with patients for Virtual Visits (Telemedicine).  Patients are able to view lab/test results, encounter notes, upcoming appointments, etc.  Non-urgent messages can be sent to your provider as well.   To learn more about what you can do with MyChart, go to [email or web address].    Your next appointment:   12 month(s)  The format for your next appointment:   In Person  Provider:   You may see Sanda Klein, MD or one of  the following Advanced Practice Providers on your designated Care Team:    Almyra Deforest, PA-C  Fabian Sharp, PA-C or   Roby Lofts, [place]

## 2021-03-02 NOTE — Progress Notes (Signed)
.    Cardiology Office Note    Date:  03/02/2021   ID:  Timothy Gates, DOB 12-10-1945, MRN [id number]  PCP:  Deland Pretty, MD  Cardiologist:  Sanda Klein, MD   Chief Complaint  Patient presents with  . muscle weakness    History of Present Illness:  Timothy Gates is a 75 y.o. male with history of coronary artery disease and bypass surgery with all arterial conduits (sequential LIMA to LAD, Dr. Roxan Hockey, 2000), obstructive sleep apnea and hyperlipidemia returning for routine follow-up.  Here today to follow-up after putting a statin on hold for about 3 months.  He continues to have substantial problems with weakness in his arms, but particularly in his legs.  If he sits down on the floor or crushes them in his garden he has a lot of trouble getting up due to very weak leg muscles.  He has to pull himself up with his arms.  He cannot get out of a chair unless he pushes up on the armrests.  Stopping the pravastatin for 3 months has led to no appreciable difference in his muscle weakness.  He has been on this medication for over 20 years, ever since his bypass procedure.  He has not had any problems with fine motor strength or diplopia. He has not had any muscle pain or skin rashes. He has gained about 4 pounds over the last year. He does not have a known adrenal or thyroid disease. He takes a vitamin D supplement and a vitamin B12 supplement. He is no longer taking iron to this cause constipation. He was told that he has "borderline anemia" at his last appointment with his primary care provider.  On pravastatin his LDL was 107.  Past Medical History:  Diagnosis Date  . Arthritis    lower back  . CAD (coronary artery disease)   . GERD (gastroesophageal reflux disease)   . Hyperlipidemia   . Iron deficiency anemia   . OSA (obstructive sleep apnea)   . RBBB (right bundle branch block) 10/28/2019   Noted on EKG  . Tinnitus    Left ear    Past Surgical History:   Procedure Laterality Date  . CORONARY ARTERY BYPASS GRAFT  04/02/1999  . DG BARIUM SWALLOW ([place] [facility]X)    . FISSURECTOMY  2006  . SHOULDER ARTHROSCOPY WITH ROTATOR CUFF REPAIR AND SUBACROMIAL DECOMPRESSION Left 03/22/2020   Procedure: RIGHT SHOULDER MINI OPEN ROTATOR CUFF REPAIR AND SUBACROMIAL DECOMPRESSION;  Surgeon: Susa Day, MD;  Location: [facility];  Service: Orthopedics;  Laterality: Left;  90 mins  . SIGMOIDOSCOPY    . UPPER GI ENDOSCOPY      Current Medications: Outpatient Medications Prior to Visit  Medication Sig Dispense Refill  . aspirin 81 MG chewable tablet Chew 81 mg by mouth daily.    . Cholecalciferol (VITAMIN D3) 1000 units CAPS Take 1,000 Units by mouth daily.    . finasteride (PROSCAR) 5 MG tablet Take 5 mg by mouth daily.    . meclizine (ANTIVERT) 25 MG tablet one tablet    . metoprolol succinate (TOPROL-XL) 50 MG 24 hr tablet Take 25 mg by mouth daily after supper. Take with or immediately following a meal.    . pantoprazole (PROTONIX) 40 MG tablet Take 40 mg by mouth daily.    . sertraline (ZOLOFT) 50 MG tablet Take 50 mg by mouth daily.    . vitamin B-12 (CYANOCOBALAMIN) 1000 MCG tablet Take 1,000 mcg by mouth daily.    Marland Kitchen  zolpidem (AMBIEN) 10 MG tablet Take 10 mg by mouth at bedtime.    . pravastatin (PRAVACHOL) 40 MG tablet TAKE 1 TABLET (40 MG TOTAL) BY MOUTH EVERY EVENING. 30 tablet 6   No facility-administered medications prior to visit.     Allergies:   Benadryl [diphenhydramine hcl]   Social History   Socioeconomic History  . Marital status: Married    Spouse name: Not on file  . Number of children: Not on file  . Years of education: Not on file  . Highest education level: Not on file  Occupational History  . Not on file  Tobacco Use  . Smoking status: Former Smoker    Types: Cigarettes    Quit date: 1993    Years since quitting: 29.2  . Smokeless tobacco: Former Systems developer    Quit date: 11/24/1992  Vaping Use  . Vaping Use: Never used   Substance and Sexual Activity  . Alcohol use: No  . Drug use: No  . Sexual activity: Not on file  Other Topics Concern  . Not on file  Social History Narrative  . Not on file   Social Determinants of Health   Financial Resource Strain: Not on file  Food Insecurity: Not on file  Transportation Needs: Not on file  Physical Activity: Not on file  Stress: Not on file  Social Connections: Not on file     Family History:  The patient's family history includes Cancer in his brother, father, maternal grandfather, maternal grandmother, sister, and sister; Diabetes in his mother; Heart attack in his paternal grandfather and paternal grandmother.   ROS:   Please see the history of present illness.    ROS all other systems are reviewed and are negative   PHYSICAL EXAM:   VS:  BP (!) 126/52 (BP Location: Left Arm, Patient Position: Sitting, Cuff Size: Normal)   Pulse 66   Ht 5\' 6"  (1.676 m)   Wt 160 lb (72.6 kg)   BMI 25.82 kg/m     General: Alert, oriented x3, no distress Head: no evidence of trauma, PERRL, EOMI, no exophtalmos or lid lag, no myxedema, no xanthelasma; normal ears, nose and oropharynx Neck: normal jugular venous pulsations and no hepatojugular reflux; brisk carotid pulses without delay and no carotid bruits Chest: clear to auscultation, no signs of consolidation by percussion or palpation, normal fremitus, symmetrical and full respiratory excursions Cardiovascular: normal position and quality of the apical impulse, regular rhythm, normal first and second heart sounds, no murmurs, rubs or gallops Abdomen: no tenderness or distention, no masses by palpation, no abnormal pulsatility or arterial bruits, normal bowel sounds, no hepatosplenomegaly Extremities: no clubbing, cyanosis or edema; 2+ radial, ulnar and brachial pulses bilaterally; 2+ right femoral, posterior tibial and dorsalis pedis pulses; 2+ left femoral, posterior tibial and dorsalis pedis pulses; no subclavian  or femoral bruits Neurological: grossly nonfocal Psych: Normal mood and affect    Wt Readings from Last 3 Encounters:  03/02/21 160 lb (72.6 kg)  12/05/20 163 lb 12.8 oz (74.3 kg)  03/22/20 161 lb 1 oz (73.1 kg)     Studies/Labs Reviewed:   EKG:  EKG is not ordered today.  Tracing from January showed sinus rhythm, old right bundle branch block, rightward axis without full-blown posterior fascicular block, no repolarization abnormalities. Recent Labs: 10/04/2019 Normal liver function tests, creatinine 0.9, glucose 105, potassium 4.8, hemoglobin 12.1 with normocytic indices, platelets 133k  Lipid Panel 10/04/2019 total cholesterol 158, triglycerides 117, HDL 29, LDL 92 03/30/2019 total  cholesterol 144, triglycerides 232, HDL 27, LDL 71 10/09/2020 total cholesterol 167, triglycerides 155, HDL 32, LDL 107   ASSESSMENT:    1. Mixed hyperlipidemia   2. Coronary artery disease involving native coronary artery of native heart without angina pectoris   3. Dyslipidemia (high LDL; low HDL)   4. History of sleep apnea   5. Bifascicular block   6. Proximal limb muscle weakness      PLAN:  In order of problems listed above:  1. CAD s/p CABG: Asymptomatic.  On aspirin.  Resume statin for target LDL less than 70. 2. HLP: Has a chronically low HDL level.  Stopping the pravastatin led to no improvement in his muscle weakness complaints.  Since his LDL is not at goal, will try rosuvastatin 20 mg daily.  Check CK on follow-up labs. 3. OSA: No longer using CPAP after weight loss. He does not have any symptoms of daytime hypersomnolence. 4. RBBB(+LPFB): The posterior fascicular block is intermittent.  No symptoms of high-grade AV conduction abnormalities. 5. Muscle weakness: Recommend neurology evaluation.  May benefit from nerve conduction study/electromyogram.  This appears to be particular prominent in his thigh muscles in a pattern of proximal myopathy. We will have him hold his statin for  at least a month. Also check for hypothyroidism. He does not have any clinical findings to suggest Cushing's syndrome. Eaton-Lambert syndrome or polymyositis should be considered if his symptoms do not improve after stopping the statin.   Medication Adjustments/Labs and Tests Ordered: Current medicines are reviewed at length with the patient today.  Concerns regarding medicines are outlined above.  Medication changes, Labs and Tests ordered today are listed in the Patient Instructions below. Patient Instructions  Medication Instructions:  STOP the Pravastatin START Rosuvastatin 20 mg once daily  *If you need a refill on your cardiac medications before your next appointment, please call your pharmacy*   Lab Work: Your provider would like for you to return in 3 months to have the following labs drawn: fasting lipid and CK. You do not need an appointment for the lab. Once in our office lobby there is a podium where you can sign in and ring the doorbell to alert [place] that you are here. The lab is open from 8:00 am to 4:30 pm; closed for lunch from 12:45pm-1:45pm.  If you have labs (blood work) drawn today and your tests are completely normal, you will receive your results only by: Marland Kitchen MyChart Message (if you have MyChart) OR . A paper copy in the mail If you have any lab test that is abnormal or we need to change your treatment, we will call you to review the results.   Testing/Procedures: None ordered   Follow-Up: At [facility], you and your health needs are our priority.  As part of our continuing mission to provide you with exceptional heart care, we have created designated Provider Care Teams.  These Care Teams include your primary Cardiologist (physician) and Advanced Practice Providers (APPs -  Physician Assistants and Nurse Practitioners) who all work together to provide you with the care you need, when you need it.  We recommend signing up for the patient portal called "MyChart".   Sign up information is provided on this After Visit Summary.  MyChart is used to connect with patients for Virtual Visits (Telemedicine).  Patients are able to view lab/test results, encounter notes, upcoming appointments, etc.  Non-urgent messages can be sent to your provider as well.   To learn more about  what you can do with MyChart, go to [email or web address].    Your next appointment:   12 month(s)  The format for your next appointment:   In Person  Provider:   You may see Sanda Klein, MD or one of the following Advanced Practice Providers on your designated Care Team:    Almyra Deforest, PA-C  Fabian Sharp, [place] or   Roby Lofts, PA-C      Signed, Sanda Klein, MD  03/02/2021 10:02 AM    [facility] [place], [place], [place]  [address] Phone: [phone]; Fax: [phone]

## 2021-03-23 DIAGNOSIS — M5459 Other low back pain: Secondary | ICD-10-CM | POA: Diagnosis not present

## 2021-04-17 ENCOUNTER — Telehealth: Payer: Self-pay | Admitting: Cardiovascular Disease

## 2021-04-17 DIAGNOSIS — I10 Essential (primary) hypertension: Secondary | ICD-10-CM | POA: Diagnosis not present

## 2021-04-17 DIAGNOSIS — E785 Hyperlipidemia, unspecified: Secondary | ICD-10-CM | POA: Diagnosis not present

## 2021-04-17 DIAGNOSIS — Z0000 Encounter for general adult medical examination without abnormal findings: Secondary | ICD-10-CM | POA: Diagnosis not present

## 2021-04-17 NOTE — Telephone Encounter (Signed)
Spoke with pt, aware lab work is due in July.

## 2021-04-17 NOTE — Telephone Encounter (Signed)
Patient requesting to find out when he was supposed to get his lab work done.

## 2021-05-18 DIAGNOSIS — M6281 Muscle weakness (generalized): Secondary | ICD-10-CM | POA: Diagnosis not present

## 2021-05-18 DIAGNOSIS — R2689 Other abnormalities of gait and mobility: Secondary | ICD-10-CM | POA: Diagnosis not present

## 2021-05-18 DIAGNOSIS — M5459 Other low back pain: Secondary | ICD-10-CM | POA: Diagnosis not present

## 2021-05-18 DIAGNOSIS — M542 Cervicalgia: Secondary | ICD-10-CM | POA: Diagnosis not present

## 2021-05-18 DIAGNOSIS — M545 Low back pain, unspecified: Secondary | ICD-10-CM | POA: Diagnosis not present

## 2021-06-01 DIAGNOSIS — E782 Mixed hyperlipidemia: Secondary | ICD-10-CM | POA: Diagnosis not present

## 2021-06-01 LAB — LIPID PANEL
Chol/HDL Ratio: 4.3 ratio (ref 0.0–5.0)
Cholesterol, Total: 115 mg/dL (ref 100–199)
HDL: 27 mg/dL — ABNORMAL LOW (ref >39)
LDL Chol Calc (NIH): 60 mg/dL (ref 0–99)
Triglycerides: 166 mg/dL — ABNORMAL HIGH (ref 0–149)
VLDL Cholesterol Cal: 28 mg/dL (ref 5–40)

## 2021-06-01 LAB — CK: Total CK: 731 U/L (ref 41–331)

## 2021-06-05 ENCOUNTER — Encounter: Payer: Self-pay | Admitting: *Deleted

## 2021-06-05 ENCOUNTER — Other Ambulatory Visit: Payer: Self-pay | Admitting: *Deleted

## 2021-06-05 DIAGNOSIS — M6281 Muscle weakness (generalized): Secondary | ICD-10-CM

## 2021-06-07 DIAGNOSIS — Z125 Encounter for screening for malignant neoplasm of prostate: Secondary | ICD-10-CM | POA: Diagnosis not present

## 2021-06-07 DIAGNOSIS — I10 Essential (primary) hypertension: Secondary | ICD-10-CM | POA: Diagnosis not present

## 2021-06-11 DIAGNOSIS — M5412 Radiculopathy, cervical region: Secondary | ICD-10-CM | POA: Diagnosis not present

## 2021-06-11 DIAGNOSIS — M5459 Other low back pain: Secondary | ICD-10-CM | POA: Diagnosis not present

## 2021-06-11 DIAGNOSIS — M5416 Radiculopathy, lumbar region: Secondary | ICD-10-CM | POA: Diagnosis not present

## 2021-06-11 DIAGNOSIS — M542 Cervicalgia: Secondary | ICD-10-CM | POA: Diagnosis not present

## 2021-06-15 DIAGNOSIS — M545 Low back pain, unspecified: Secondary | ICD-10-CM | POA: Diagnosis not present

## 2021-06-15 DIAGNOSIS — M542 Cervicalgia: Secondary | ICD-10-CM | POA: Diagnosis not present

## 2021-07-02 ENCOUNTER — Encounter: Payer: Self-pay | Admitting: *Deleted

## 2021-08-16 DIAGNOSIS — Z23 Encounter for immunization: Secondary | ICD-10-CM | POA: Diagnosis not present

## 2021-08-21 ENCOUNTER — Ambulatory Visit: Payer: Medicare Other | Admitting: Neurology

## 2021-09-12 ENCOUNTER — Telehealth: Payer: Self-pay | Admitting: Cardiovascular Disease

## 2021-09-12 NOTE — Telephone Encounter (Signed)
Pt c/o Shortness Of Breath: STAT if SOB developed within the last 24 hours or pt is noticeably SOB on the phone  1. Are you currently SOB (can you hear that pt is SOB on the phone)? no  2. How long have you been experiencing SOB? Two months  3. Are you SOB when sitting or when up moving around? Both, when he is getting up from the couch, bending down to tie his shoes.   4. Are you currently experiencing any other symptoms? Sometimes he has a little bit of burning in the middle of his chest, sometimes it happens when he has the SOB.

## 2021-09-12 NOTE — Telephone Encounter (Signed)
Pt called to report that he has been having worsening dyspnea with exertion over the past few weeks.. he says he has always had it but it has been bothering him more lately.. he denies chest pain but says his chest has some pressure type discomfort when he is trying to catch his breath.. he denies palpitations, edema, he can still lay flat to sleep at night.   I made him an appt for 09/18/21 but he agrees that if his symptoms worsen or change he will have someone take him to the ED.

## 2021-09-13 DIAGNOSIS — L820 Inflamed seborrheic keratosis: Secondary | ICD-10-CM | POA: Diagnosis not present

## 2021-09-13 DIAGNOSIS — D1801 Hemangioma of skin and subcutaneous tissue: Secondary | ICD-10-CM | POA: Diagnosis not present

## 2021-09-13 DIAGNOSIS — Z85828 Personal history of other malignant neoplasm of skin: Secondary | ICD-10-CM | POA: Diagnosis not present

## 2021-09-13 DIAGNOSIS — L821 Other seborrheic keratosis: Secondary | ICD-10-CM | POA: Diagnosis not present

## 2021-09-13 DIAGNOSIS — L570 Actinic keratosis: Secondary | ICD-10-CM | POA: Diagnosis not present

## 2021-09-16 NOTE — Progress Notes (Signed)
Cardiology Clinic Note   Patient Name: Timothy Gates Date of Encounter: 09/18/2021  Primary Care Provider:  Deland Pretty, MD Primary Cardiologist:  Sanda Klein, MD  Patient Profile    Timothy Gates 74 year old male presents the clinic today for evaluation of his shortness of breath.  Past Medical History    Past Medical History:  Diagnosis Date   Arthritis    lower back   CAD (coronary artery disease)    GERD (gastroesophageal reflux disease)    Hyperlipidemia    Iron deficiency anemia    OSA (obstructive sleep apnea)    RBBB (right bundle branch block) 10/28/2019   Noted on EKG   Tinnitus    Left ear   Past Surgical History:  Procedure Laterality Date   CORONARY ARTERY BYPASS GRAFT  04/02/1999   DG BARIUM SWALLOW ([place] [facility]X)     FISSURECTOMY  2006   SHOULDER ARTHROSCOPY WITH ROTATOR CUFF REPAIR AND SUBACROMIAL DECOMPRESSION Left 03/22/2020   Procedure: RIGHT SHOULDER MINI OPEN ROTATOR CUFF REPAIR AND SUBACROMIAL DECOMPRESSION;  Surgeon: Susa Day, MD;  Location: [facility];  Service: Orthopedics;  Laterality: Left;  90 mins   SIGMOIDOSCOPY     UPPER GI ENDOSCOPY      Allergies  Allergies  Allergen Reactions   Benadryl [Diphenhydramine Hcl] Other (See Comments)    Headache    History of Present Illness    Timothy Gates has a PMH of coronary artery disease, OSA on CPAP, hyperlipidemia, and dyslipidemia.  He underwent CABG with a LIMA-LAD by Dr. Roxan Hockey in 2000.  He previously felt that statins were causing him to have weakness.  His statins were held for 3 months and he continued to have substantial problems with weakness in his arms and legs.  He previously has had trouble getting up from the floor or from the ground after gardening.  This was felt to be due to weak leg muscles.  He described having to call himself up with his arms.  He has previously not been able to get up out of the chair without pushing on the armrests.  There was not  significant difference noted after his 59-month hold of pravastatin.  While on pravastatin he was noted to have an LDL of 107.  He followed up with Dr. Sallyanne Kuster 03/02/21.  He denied further problems with fine motor strength or diplopia.  He denied muscle pain or skin rashes.  He had gained around 4 pounds over the previous year.  He discontinued iron due to constipation.  He was previously told by his PCP that he was borderline anemic.  He presents to the clinic today for follow-up evaluation states he has noticed increased shortness of breath and dyspnea with exertion while doing activities such as bending over and yard work.  He is now limiting his physical activity due to his progressing lower extremity weakness.  We reviewed his previous borderline anemia.  He reports that he could eat more iron in his diet.  We reviewed iron rich foods.  I recommended that he start preparing foods and cast iron pans.  He denies bleeding issues.  We reviewed his lab work from 7/22.  I will order a CBC, BMP, echocardiogram, give him chair exercises, and plan follow-up for after his echocardiogram.  Today he denies chest pain,  lower extremity edema, palpitations, melena, hematuria, hemoptysis, diaphoresis, weakness, presyncope, syncope, orthopnea, and PND.  Home Medications    Prior to Admission medications   Medication Sig Start Date  End Date Taking? Authorizing Provider  aspirin 81 MG chewable tablet Chew 81 mg by mouth daily.    [provider]  Cholecalciferol (VITAMIN D3) 1000 units CAPS Take 1,000 Units by mouth daily.    [provider]  finasteride (PROSCAR) 5 MG tablet Take 5 mg by mouth daily.    [provider]  meclizine (ANTIVERT) 25 MG tablet one tablet 11/30/14   [provider]  metoprolol succinate (TOPROL-XL) 50 MG 24 hr tablet Take 25 mg by mouth daily after supper. Take with or immediately following a meal.    [provider]  pantoprazole (PROTONIX) 40  MG tablet Take 40 mg by mouth daily.    [provider]  rosuvastatin (CRESTOR) 20 MG tablet Take 1 tablet (20 mg total) by mouth daily. 03/02/21 05/31/21  Croitoru, Mihai, MD  sertraline (ZOLOFT) 50 MG tablet Take 50 mg by mouth daily.    [provider]  vitamin B-12 (CYANOCOBALAMIN) 1000 MCG tablet Take 1,000 mcg by mouth daily.    [provider]  zolpidem (AMBIEN) 10 MG tablet Take 10 mg by mouth at bedtime.    [provider]    Family History    Family History  Problem Relation Age of Onset   Cancer Father    Diabetes Mother    Cancer Sister    Cancer Sister    Cancer Maternal Grandmother    Cancer Maternal Grandfather    Heart attack Paternal Grandmother    Heart attack Paternal Grandfather    Cancer Brother    He indicated that his mother is deceased. He indicated that his father is deceased. He indicated that three of his six sisters are alive. He indicated that four of his six brothers are alive. He indicated that his maternal grandmother is deceased. He indicated that his maternal grandfather is deceased. He indicated that his paternal grandmother is deceased. He indicated that his paternal grandfather is deceased. He indicated that both of his sons are alive.  Social History    Social History   Socioeconomic History   Marital status: Married    Spouse name: Not on file   Number of children: Not on file   Years of education: Not on file   Highest education level: Not on file  Occupational History   Not on file  Tobacco Use   Smoking status: Former    Types: Cigarettes    Quit date: 1993    Years since quitting: 29.8   Smokeless tobacco: Former    Quit date: 11/24/1992  Vaping Use   Vaping Use: Never used  Substance and Sexual Activity   Alcohol use: No   Drug use: No   Sexual activity: Not on file  Other Topics Concern   Not on file  Social History Narrative   Not on file   Social Determinants of Health   Financial  Resource Strain: Not on file  Food Insecurity: Not on file  Transportation Needs: Not on file  Physical Activity: Not on file  Stress: Not on file  Social Connections: Not on file  Intimate Partner Violence: Not on file     Review of Systems    General:  No chills, fever, night sweats or weight changes.  Cardiovascular:  No chest pain, dyspnea on exertion, edema, orthopnea, palpitations, paroxysmal nocturnal dyspnea. Dermatological: No rash, lesions/masses Respiratory: No cough, dyspnea Urologic: No hematuria, dysuria Abdominal:   No nausea, vomiting, diarrhea, bright red blood per rectum, melena, or hematemesis  Neurologic:  No visual changes, generalized weakness bilateral upper and lower extremities, changes in mental status. All other systems reviewed and are otherwise negative except as noted above.  Physical Exam    VS:  BP 136/68 (BP Location: Left Arm, Patient Position: Sitting, Cuff Size: Normal)   Pulse 64   Ht 5\' 7"  (1.702 m)   Wt 161 lb 3.2 oz (73.1 kg)   BMI 25.25 kg/m  , BMI Body mass index is 25.25 kg/m. GEN: Well nourished, well developed, in no acute distress. HEENT: normal. Neck: Supple, no JVD, carotid bruits, or masses. Cardiac: RRR, no murmurs, rubs, or gallops. No clubbing, cyanosis, edema.  Radials/DP/PT 2+ and equal bilaterally.  Respiratory:  Respirations regular and unlabored, clear to auscultation bilaterally. GI: Soft, nontender, nondistended, BS + x 4. MS: no deformity or atrophy. Skin: warm and dry, no rash. Neuro:  Strength and sensation are intact. Psych: Normal affect.  Accessory Clinical Findings    Recent Labs: 12/05/2020: TSH 2.320   Recent Lipid Panel    Component Value Date/Time   CHOL 115 06/01/2021 0813   TRIG 166 (H) 06/01/2021 0813   HDL 27 (L) 06/01/2021 0813   CHOLHDL 4.3 06/01/2021 0813   LDLCALC 60 06/01/2021 0813    ECG personally reviewed by me today-normal sinus rhythm right bundle branch block 64 bpm- No acute  changes  Nuclear stress test 08/09/2016  The left ventricular ejection fraction is normal (55-65%). Nuclear stress EF: 58%. Septal wall hypokinesis, post CABG Horizontal ST segment depression ST segment depression of 1 mm was noted during stress in the V5 leads. This is a low risk study. No ischemia identified. No perfusion defects.   Candee Furbish, MD  Assessment & Plan   1.  Shortness of breath/DOE-reports increased work of breathing with increased physical activity.  Reports some decreased activity tolerance.  Notes he has been fatiguing more easily.  Denies bleeding issues.  Previously noted to have borderline anemia. Order echocardiogram Order BMP and CBC Iron rich diet-diet instructions given  Coronary artery disease-denies recent episodes of arm neck back or chest discomfort.  Underwent CABG by Dr. Roxan Hockey in 2000.  Nuclear stress test 2017 showed EF 58%, no ischemia and was considered low risk. Continue rosuvastatin, metoprolol, aspirin, Heart healthy low-sodium diet-salty 6 given Increase physical activity as tolerated  Dyslipidemia-06/01/2021: Cholesterol, Total 115; HDL 27; LDL Chol Calc (NIH) 60; Triglycerides 166 previously stopped pravastatin due to muscle weakness.  Weakness symptoms did not improve after 22-month-old. Continue rosuvastatin Heart healthy low-sodium diet-salty 6 given Increase physical activity as tolerated  Essential hypertension-BP today 136/68.  Well-controlled at home. Continue metoprolol Heart healthy low-sodium diet-salty 6 given Increase physical activity as tolerated  OSA-reports compliance with his CPAP and is waking up well rested. Continue CPAP use  Lower extremity weakness/generalized weakness-continues to have progressing lower extremity weakness.  Limited in his physical activity. Start chair exercises Increase physical activity as tolerated Follow-up PCP  Disposition: Follow-up with Dr. Sallyanne Kuster or me after echocardiogram   Jossie Ng. Ebba Goll NP-C    09/18/2021, 3:20 PM [facility] [address] [address] Office [phone] Fax [phone]  Notice: This dictation was prepared with Dragon dictation along with smaller phrase technology. Any transcriptional errors that result from this process are unintentional and may not be corrected upon review.  I spent 14 minutes examining this patient, reviewing medications, and using patient centered shared decision making involving her cardiac care.  Prior to her visit I spent  greater than 20 minutes reviewing her past medical history,  medications, and prior cardiac tests.

## 2021-09-18 ENCOUNTER — Encounter: Payer: Self-pay | Admitting: General Practice

## 2021-09-18 ENCOUNTER — Other Ambulatory Visit: Payer: Self-pay

## 2021-09-18 ENCOUNTER — Ambulatory Visit (INDEPENDENT_AMBULATORY_CARE_PROVIDER_SITE_OTHER): Payer: Medicare Other | Admitting: General Practice

## 2021-09-18 VITALS — BP 136/68 | HR 64 | Ht 67.0 in | Wt 161.2 lb

## 2021-09-18 DIAGNOSIS — I10 Essential (primary) hypertension: Secondary | ICD-10-CM | POA: Diagnosis not present

## 2021-09-18 DIAGNOSIS — E785 Hyperlipidemia, unspecified: Secondary | ICD-10-CM | POA: Diagnosis not present

## 2021-09-18 DIAGNOSIS — R0609 Other forms of dyspnea: Secondary | ICD-10-CM

## 2021-09-18 DIAGNOSIS — G4733 Obstructive sleep apnea (adult) (pediatric): Secondary | ICD-10-CM | POA: Diagnosis not present

## 2021-09-18 DIAGNOSIS — Z79899 Other long term (current) drug therapy: Secondary | ICD-10-CM | POA: Diagnosis not present

## 2021-09-18 DIAGNOSIS — R531 Weakness: Secondary | ICD-10-CM | POA: Diagnosis not present

## 2021-09-18 DIAGNOSIS — Z9989 Dependence on other enabling machines and devices: Secondary | ICD-10-CM | POA: Diagnosis not present

## 2021-09-18 DIAGNOSIS — R0602 Shortness of breath: Secondary | ICD-10-CM | POA: Diagnosis not present

## 2021-09-18 DIAGNOSIS — R5383 Other fatigue: Secondary | ICD-10-CM

## 2021-09-18 DIAGNOSIS — I2510 Atherosclerotic heart disease of native coronary artery without angina pectoris: Secondary | ICD-10-CM | POA: Diagnosis not present

## 2021-09-18 NOTE — Patient Instructions (Signed)
Medication Instructions:  START MULTI-VITAMIN *If you need a refill on your cardiac medications before your next appointment, please call your pharmacy*  Lab Work: CBC AND BMET TODAY If you have labs (blood work) drawn today and your tests are completely normal, you will receive your results only by:  [place] (if you have MyChart) OR A paper copy in the mail.  If you have any lab test that is abnormal or we need to change your treatment, we will call you to review the results. You may go to any [facility] that is convenient for you however, we do have a lab in our office that is able to assist you. You DO NOT need an appointment for our lab. The lab is open 8:00am and closes at 4:00pm. Lunch 12:45 - 1:45pm.  Testing/Procedures: Echocardiogram - Your physician has requested that you have an echocardiogram. Echocardiography is a painless test that uses sound waves to create images of your heart. It provides your doctor with information about the size and shape of your heart and how well your heart's chambers and valves are working. This procedure takes approximately one hour. There are no restrictions for this procedure. This will be performed at our [facility] location - [address].   Special Instructions DO CHAIR EXERCISES DAILY-ATTACHED  PLEASE READ AND FOLLOW IRON RICH DIET-ATTACHED   Follow-Up: Your next appointment:  AFTER ECHO  In Person with Timothy Klein, Timothy Gates OR IF UNAVAILABLE Timothy CLEAVER, Timothy Gates  At [facility], you and your health needs are our priority.  As part of our continuing mission to provide you with exceptional heart care, we have created designated Provider Care Teams.  These Care Teams include your primary Cardiologist (physician) and Advanced Practice Providers (APPs -  Physician Assistants and Nurse Practitioners) who all work together to provide you with the care you need, when you need it.    Iron-Rich Diet Iron is a mineral that helps your  body produce hemoglobin. Hemoglobin is a protein in red blood cells that carries oxygen to your body's tissues. Eating too little iron may cause you to feel weak and tired, and it can increase your risk of infection. Iron is naturally found in many foods, and many foods have iron added to them (are iron-fortified). You may need to follow an iron-rich diet if you do not have enough iron in your body due to certain medical conditions. The amount of iron that you need each day depends on your age, your sex, and any medical conditions you have. Follow instructions from your health care provider or a dietitian about how much iron you should eat each day. What are tips for following this plan? Reading food labels Check food labels to see how many milligrams (mg) of iron are in each serving. Cooking Cook foods in pots and pans that are made from iron. Take these steps to make it easier for your body to absorb iron from certain foods: Soak beans overnight before cooking. Soak whole grains overnight and drain them before using. Ferment flours before baking, such as by using yeast in bread dough. Meal planning When you eat foods that contain iron, you should eat them with foods that are high in vitamin C. These include oranges, peppers, tomatoes, potatoes, and mangoes. Vitamin C helps your body absorb iron. Certain foods and drinks prevent your body from absorbing iron properly. Avoid eating these foods in the same meal as iron-rich foods or with iron supplements. These foods  include: Coffee, black tea, and red wine. Milk, dairy products, and foods that are high in calcium. Beans and soybeans. Whole grains. General information Take iron supplements only as told by your health care provider. An overdose of iron can be life-threatening. If you were prescribed iron supplements, take them with orange juice or a vitamin C supplement. When you eat iron-fortified foods or take an iron supplement, you should also  eat foods that naturally contain iron, such as meat, poultry, and fish. Eating naturally iron-rich foods helps your body absorb the iron that is added to other foods or contained in a supplement. Iron from animal sources is better absorbed than iron from plant sources. What foods should I eat? Fruits Prunes. Raisins. Eat fruits high in vitamin C, such as oranges, grapefruits, and strawberries, with iron-rich foods. Vegetables Spinach (cooked). Green peas. Broccoli. Fermented vegetables. Eat vegetables high in vitamin C, such as leafy greens, potatoes, bell peppers, and tomatoes, with iron-rich foods. Grains Iron-fortified breakfast cereal. Iron-fortified whole-wheat bread. Enriched rice. Sprouted grains. Meats and other proteins Beef liver. Beef. [place]. Chicken. Oysters. Shrimp. [place]. Sardines. Chickpeas. Nuts. Tofu. Pumpkin seeds. Beverages Tomato juice. Fresh orange juice. Prune juice. Hibiscus tea. Iron-fortified instant breakfast shakes. Sweets and desserts Blackstrap molasses. Seasonings and condiments Tahini. Fermented soy sauce. Other foods Wheat germ. The items listed above may not be a complete list of recommended foods and beverages. Contact a dietitian for more information. What foods should I limit? These are foods that should be limited while eating iron-rich foods as they can reduce the absorption of iron in your body. Grains Whole grains. Bran cereal. Bran flour. Meats and other proteins Soybeans. Products made from soy protein. Black beans. Lentils. Mung beans. Split peas. Dairy Milk. Cream. Cheese. Yogurt. Cottage cheese. Beverages Coffee. Black tea. Red wine. Sweets and desserts Cocoa. Chocolate. Ice cream. Seasonings and condiments Basil. Oregano. Large amounts of parsley. The items listed above may not be a complete list of foods and beverages you should limit. Contact a dietitian for more information. Summary Iron is a mineral that helps your body produce  hemoglobin. Hemoglobin is a protein in red blood cells that carries oxygen to your body's tissues. Iron is naturally found in many foods, and many foods have iron added to them (are iron-fortified). When you eat foods that contain iron, you should eat them with foods that are high in vitamin C. Vitamin C helps your body absorb iron. Certain foods and drinks prevent your body from absorbing iron properly, such as whole grains and dairy products. You should avoid eating these foods in the same meal as iron-rich foods or with iron supplements. This information is not intended to replace advice given to you by your health care provider. Make sure you discuss any questions you have with your health care provider. Document Revised: 10/23/2020 Document Reviewed: 10/23/2020 Elsevier Patient Education  2022 [employer].   Exercises to do While Sitting Exercises that you do while sitting (chair exercises) can give you many of the same benefits as full exercise. Benefits include strengthening your heart, burning calories, and keeping muscles and joints healthy. Exercise can also improve your mood and help with depression and anxiety. You may benefit from chair exercises if you are unable to do standing exercises due to: Diabetic foot pain. Obesity. Illness. Arthritis. Recovery from surgery or injury. Breathing problems. Balance problems. Another type of disability. Before starting chair exercises, check with your health care provider or a physical therapist to find out how  much exercise you can tolerate and which exercises are safe for you. If your health care provider approves: Start out slowly and build up over time. Aim to work up to about 10-20 minutes for each exercise session. Make exercise part of your daily routine. Drink water when you exercise. Do not wait until you are thirsty. Drink every 10-15 minutes. Stop exercising right away if you have pain, nausea, shortness of breath, or  dizziness. If you are exercising in a wheelchair, make sure to lock the wheels. Ask your health care provider whether you can do tai chi or yoga. Many positions in these mind-body exercises can be modified to do while seated. Warm-up Before starting other exercises: Sit up as straight as you can. Have your knees bent at 90 degrees, which is the shape of the capital letter "L." Keep your feet flat on the floor. Sit at the front edge of your chair, if you can. Pull in (tighten) the muscles in your abdomen and stretch your spine and neck as straight as you can. Hold this position for a few minutes. Breathe in and out evenly. Try to concentrate on your breathing, and relax your mind. Stretching Exercise A: Arm stretch Hold your arms out straight in front of your body. Bend your hands at the wrist with your fingers pointing up, as if signaling someone to stop. Notice the slight tension in your forearms as you hold the position. Keeping your arms out and your hands bent, rotate your hands outward as far as you can and hold this stretch. Aim to have your thumbs pointing up and your pinkie fingers pointing down. Slowly repeat arm stretches for one minute as tolerated. Exercise B: Leg stretch If you can move your legs, try to "draw" letters on the floor with the toes of your foot. Write your name with one foot. Write your name with the toes of your other foot. Slowly repeat the movements for one minute as tolerated. Exercise C: Reach for the sky Reach your hands as far over your head as you can to stretch your spine. Move your hands and arms as if you are climbing a rope. Slowly repeat the movements for one minute as tolerated. Range of motion exercises Exercise A: Shoulder roll Let your arms hang loosely at your sides. Lift just your shoulders up toward your ears, then let them relax back down. When your shoulders feel loose, rotate your shoulders in backward and forward circles. Do shoulder  rolls slowly for one minute as tolerated. Exercise B: March in place As if you are marching, pump your arms and lift your legs up and down. Lift your knees as high as you can. If you are unable to lift your knees, just pump your arms and move your ankles and feet up and down. March in place for one minute as tolerated. Exercise C: Seated jumping jacks Let your arms hang down straight. Keeping your arms straight, lift them up over your head. Aim to point your fingers to the ceiling. While you lift your arms, straighten your legs and slide your heels along the floor to your sides, as wide as you can. As you bring your arms back down to your sides, slide your legs back together. If you are unable to use your legs, just move your arms. Slowly repeat seated jumping jacks for one minute as tolerated. Strengthening exercises Exercise A: Shoulder squeeze Hold your arms straight out from your body to your sides, with your elbows bent and  your fists pointed at the ceiling. Keeping your arms in the bent position, move them forward so your elbows and forearms meet in front of your face. Open your arms back out as wide as you can with your elbows still bent, until you feel your shoulder blades squeezing together. Hold for 5 seconds. Slowly repeat the movements forward and backward for one minute as tolerated. Contact a health care provider if: You have to stop exercising due to any of the following: Pain. Nausea. Shortness of breath. Dizziness. Fatigue. You have significant pain or soreness after exercising. Get help right away if: You have chest pain. You have difficulty breathing. These symptoms may represent a serious problem that is an emergency. Do not wait to see if the symptoms will go away. Get medical help right away. Call your local emergency services (911 in the [place] Do not drive yourself to the hospital. Summary Exercises that you do while sitting (chair exercises) can strengthen your  heart, burn calories, and keep muscles and joints healthy. You may benefit from chair exercises if you are unable to do standing exercises due to diabetic foot pain, obesity, recovery from surgery or injury, or other conditions. Before starting chair exercises, check with your health care provider or a physical therapist to find out how much exercise you can tolerate and which exercises are safe for you. This information is not intended to replace advice given to you by your health care provider. Make sure you discuss any questions you have with your health care provider. Document Revised: 01/07/2021 Document Reviewed: 01/07/2021 Elsevier Patient Education  2022 [employer].

## 2021-09-19 LAB — BASIC METABOLIC PANEL
BUN/Creatinine Ratio: 22 (ref 10–24)
BUN: 17 mg/dL (ref 8–27)
CO2: 23 mmol/L (ref 20–29)
Calcium: 9.4 mg/dL (ref 8.6–10.2)
Chloride: 102 mmol/L (ref 96–106)
Creatinine, Ser: 0.77 mg/dL (ref 0.76–1.27)
Glucose: 94 mg/dL (ref 70–99)
Potassium: 4.8 mmol/L (ref 3.5–5.2)
Sodium: 140 mmol/L (ref 134–144)
eGFR: 93 mL/min/{1.73_m2} (ref >59)

## 2021-09-19 LAB — CBC
Hematocrit: 37.8 % (ref 37.5–51.0)
Hemoglobin: 12.4 g/dL — ABNORMAL LOW (ref 13.0–17.7)
MCH: 27.1 pg (ref 26.6–33.0)
MCHC: 32.8 g/dL (ref 31.5–35.7)
MCV: 83 fL (ref 79–97)
Platelets: 154 10*3/uL (ref 150–450)
RBC: 4.58 x10E6/uL (ref 4.14–5.80)
RDW: 13.9 % (ref 11.6–15.4)
WBC: 6.7 10*3/uL (ref 3.4–10.8)

## 2021-10-01 ENCOUNTER — Ambulatory Visit (HOSPITAL_COMMUNITY): Payer: Medicare Other | Attending: General Practice

## 2021-10-01 ENCOUNTER — Other Ambulatory Visit: Payer: Self-pay

## 2021-10-01 DIAGNOSIS — R531 Weakness: Secondary | ICD-10-CM | POA: Diagnosis not present

## 2021-10-01 DIAGNOSIS — R0602 Shortness of breath: Secondary | ICD-10-CM | POA: Diagnosis not present

## 2021-10-01 DIAGNOSIS — R0609 Other forms of dyspnea: Secondary | ICD-10-CM | POA: Insufficient documentation

## 2021-10-01 DIAGNOSIS — R5383 Other fatigue: Secondary | ICD-10-CM | POA: Diagnosis not present

## 2021-10-02 LAB — ECHOCARDIOGRAM COMPLETE
Area-P 1/2: 3.43 cm2
MV M vel: 4.66 m/s
MV Peak grad: 86.9 mmHg
S' Lateral: 1.8 cm

## 2021-10-14 NOTE — Progress Notes (Signed)
Cardiology Clinic Note   Patient Name: Timothy Gates Date of Encounter: 10/15/2021  Primary Care Provider:  Deland Pretty, MD Primary Cardiologist:  Sanda Klein, MD  Patient Profile    Timothy Gates 75 year old male presents the clinic today for follow-up for evaluation of his shortness of breath.  Past Medical History    Past Medical History:  Diagnosis Date   Arthritis    lower back   CAD (coronary artery disease)    GERD (gastroesophageal reflux disease)    Hyperlipidemia    Iron deficiency anemia    OSA (obstructive sleep apnea)    RBBB (right bundle branch block) 10/28/2019   Noted on EKG   Tinnitus    Left ear   Past Surgical History:  Procedure Laterality Date   CORONARY ARTERY BYPASS GRAFT  04/02/1999   DG BARIUM SWALLOW ([place] HX)     FISSURECTOMY  2006   SHOULDER ARTHROSCOPY WITH ROTATOR CUFF REPAIR AND SUBACROMIAL DECOMPRESSION Left 03/22/2020   Procedure: RIGHT SHOULDER MINI OPEN ROTATOR CUFF REPAIR AND SUBACROMIAL DECOMPRESSION;  Surgeon: Susa Day, MD;  Location: [facility];  Service: Orthopedics;  Laterality: Left;  90 mins   SIGMOIDOSCOPY     UPPER GI ENDOSCOPY      Allergies  Allergies  Allergen Reactions   Benadryl [Diphenhydramine Hcl] Other (See Comments)    Headache    History of Present Illness    Timothy Gates has a PMH of coronary artery disease, OSA on CPAP, hyperlipidemia, and dyslipidemia.  He underwent CABG with a LIMA-LAD by Dr. Roxan Hockey in 2000.  He previously felt that statins were causing him to have weakness.  His statins were held for 3 months and he continued to have substantial problems with weakness in his arms and legs.  He previously has had trouble getting up from the floor or from the ground after gardening.  This was felt to be due to weak leg muscles.  He described having to call himself up with his arms.  He has previously not been able to get up out of the chair without pushing on the armrests.  There  was not significant difference noted after his 23-month hold of pravastatin.  While on pravastatin he was noted to have an LDL of 107.  He followed up with Dr. Sallyanne Kuster 03/02/21.  He denied further problems with fine motor strength or diplopia.  He denied muscle pain or skin rashes.  He had gained around 4 pounds over the previous year.  He discontinued iron due to constipation.  He was previously told by his PCP that he was borderline anemic.  He presented to the clinic 10/01/2021 for follow-up evaluation states he had noticed increased shortness of breath and dyspnea with exertion while doing activities such as bending over and yard work.  He was limiting his physical activity due to his progressing lower extremity weakness.  We reviewed his previous borderline anemia.  He reported that he could eat more iron in his diet.  We reviewed iron rich foods.  I recommended that he start preparing foods in cast iron pans.  He denied bleeding issues.  We reviewed his lab work from 7/22.  I ordered a CBC, BMP, echocardiogram, gave him chair exercises, and planned follow-up for after his echocardiogram.  His echocardiogram 10/01/2021 showed an LVEF of 60-65%, intermediate diastolic parameters, mild mitral valve regurgitation, and moderate mitral annular calcification with no stenosis.  No other significant valvular abnormalities were noted.  He presents the clinic  today for follow-up evaluation states he feels well today.  He feels that his breathing has improved.  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  He plans to plant flowers next week with the nicer weather.  We reviewed his echocardiogram and lab work.  He expressed understanding.  I will plan follow-up for 12 months and as needed.  Today he denies chest pain,  lower extremity edema, palpitations, melena, hematuria, hemoptysis, diaphoresis, weakness, presyncope, syncope, orthopnea, and PND.  Home Medications    Prior to  Admission medications   Medication Sig Start Date End Date Taking? Authorizing Provider  aspirin 81 MG chewable tablet Chew 81 mg by mouth daily.    [provider]  Cholecalciferol (VITAMIN D3) 1000 units CAPS Take 1,000 Units by mouth daily.    [provider]  finasteride (PROSCAR) 5 MG tablet Take 5 mg by mouth daily.    [provider]  meclizine (ANTIVERT) 25 MG tablet one tablet 11/30/14   [provider]  metoprolol succinate (TOPROL-XL) 50 MG 24 hr tablet Take 25 mg by mouth daily after supper. Take with or immediately following a meal.    [provider]  pantoprazole (PROTONIX) 40 MG tablet Take 40 mg by mouth daily.    [provider]  rosuvastatin (CRESTOR) 20 MG tablet Take 1 tablet (20 mg total) by mouth daily. 03/02/21 05/31/21  Croitoru, Mihai, MD  sertraline (ZOLOFT) 50 MG tablet Take 50 mg by mouth daily.    [provider]  vitamin B-12 (CYANOCOBALAMIN) 1000 MCG tablet Take 1,000 mcg by mouth daily.    [provider]  zolpidem (AMBIEN) 10 MG tablet Take 10 mg by mouth at bedtime.    [provider]    Family History    Family History  Problem Relation Age of Onset   Cancer Father    Diabetes Mother    Cancer Sister    Cancer Sister    Cancer Maternal Grandmother    Cancer Maternal Grandfather    Heart attack Paternal Grandmother    Heart attack Paternal Grandfather    Cancer Brother    He indicated that his mother is deceased. He indicated that his father is deceased. He indicated that three of his six sisters are alive. He indicated that four of his six brothers are alive. He indicated that his maternal grandmother is deceased. He indicated that his maternal grandfather is deceased. He indicated that his paternal grandmother is deceased. He indicated that his paternal grandfather is deceased. He indicated that both of his sons are alive.  Social History    Social History   Socioeconomic  History   Marital status: Married    Spouse name: Not on file   Number of children: Not on file   Years of education: Not on file   Highest education level: Not on file  Occupational History   Not on file  Tobacco Use   Smoking status: Former    Types: Cigarettes    Quit date: 1993    Years since quitting: 29.9   Smokeless tobacco: Former    Quit date: 11/24/1992  Vaping Use   Vaping Use: Never used  Substance and Sexual Activity   Alcohol use: No   Drug use: No   Sexual activity: Not on file  Other Topics Concern   Not on file  Social History Narrative   Not on file   Social Determinants of Health   Financial  Resource Strain: Not on file  Food Insecurity: Not on file  Transportation Needs: Not on file  Physical Activity: Not on file  Stress: Not on file  Social Connections: Not on file  Intimate Partner Violence: Not on file     Review of Systems    General:  No chills, fever, night sweats or weight changes.  Cardiovascular:  No chest pain, dyspnea on exertion, edema, orthopnea, palpitations, paroxysmal nocturnal dyspnea. Dermatological: No rash, lesions/masses Respiratory: No cough, dyspnea Urologic: No hematuria, dysuria Abdominal:   No nausea, vomiting, diarrhea, bright red blood per rectum, melena, or hematemesis Neurologic:  No visual changes, generalized weakness bilateral upper and lower extremities, changes in mental status. All other systems reviewed and are otherwise negative except as noted above.  Physical Exam    VS:  BP 136/62   Pulse (!) 58   Ht 5\' 7"  (1.702 m)   Wt 163 lb (73.9 kg)   SpO2 99%   BMI 25.53 kg/m  , BMI Body mass index is 25.53 kg/m. GEN: Well nourished, well developed, in no acute distress. HEENT: normal. Neck: Supple, no JVD, carotid bruits, or masses. Cardiac: RRR, no murmurs, rubs, or gallops. No clubbing, cyanosis, edema.  Radials/DP/PT 2+ and equal bilaterally.  Respiratory:  Respirations regular and unlabored, clear to  auscultation bilaterally. GI: Soft, nontender, nondistended, BS + x 4. MS: no deformity or atrophy. Skin: warm and dry, no rash. Neuro:  Strength and sensation are intact. Psych: Normal affect.  Accessory Clinical Findings    Recent Labs: 12/05/2020: TSH 2.320 09/18/2021: BUN 17; Creatinine, Ser 0.77; Hemoglobin 12.4; Platelets 154; Potassium 4.8; Sodium 140   Recent Lipid Panel    Component Value Date/Time   CHOL 115 06/01/2021 0813   TRIG 166 (H) 06/01/2021 0813   HDL 27 (L) 06/01/2021 0813   CHOLHDL 4.3 06/01/2021 0813   LDLCALC 60 06/01/2021 0813    ECG personally reviewed by me today-normal sinus rhythm right bundle branch block 64 bpm- No acute changes  Nuclear stress test 08/09/2016  The left ventricular ejection fraction is normal (55-65%). Nuclear stress EF: 58%. Septal wall hypokinesis, post CABG Horizontal ST segment depression ST segment depression of 1 mm was noted during stress in the V5 leads. This is a low risk study. No ischemia identified. No perfusion defects.   Candee Furbish, MD  Echocardiogram 10/01/2021 IMPRESSIONS     1. Left ventricular ejection fraction, by estimation, is 60 to 65%. The  left ventricle has normal function. The left ventricle has no regional  wall motion abnormalities. There is mild concentric left ventricular  hypertrophy. Left ventricular diastolic  parameters are indeterminate.   2. Right ventricular systolic function is normal. The right ventricular  size is normal. Tricuspid regurgitation signal is inadequate for assessing  PA pressure.   3. The mitral valve is abnormal. Mild mitral valve regurgitation. No  evidence of mitral stenosis. Moderate mitral annular calcification.   4. The aortic valve is tricuspid. Aortic valve regurgitation is not  visualized. No aortic stenosis is present.   Comparison(s): No prior Echocardiogram.  Assessment & Plan   1.  Shortness of breath/DOE-improving.  Previously reported increased work  of breathing with increased physical activity.  Reported some decreased activity tolerance.    Denied bleeding issues.  Follow-up echocardiogram showed normal LVEF with intermediate diastolic function.  Details above.  Previously noted to have borderline anemia.  Follow-up lab work showed stable labs. Continue iron rich diet-diet instructions given Increase physical activity as tolerated  Coronary artery disease-denies chest pain.  Underwent CABG by Dr. Roxan Hockey in 2000.  Nuclear stress test 2017 showed EF 58%, no ischemia and was considered low risk. Continue rosuvastatin, metoprolol, aspirin, Heart healthy low-sodium diet-salty 6 given Increase physical activity as tolerated  Dyslipidemia-06/01/2021: Cholesterol, Total 115; HDL 27; LDL Chol Calc (NIH) 60; Triglycerides 166 previously stopped pravastatin due to muscle weakness.  Weakness symptoms did not improve after 11-month-old. Continue rosuvastatin Heart healthy low-sodium diet-salty 6 given Increase physical activity as tolerated  Essential hypertension-BP today 136/62.  Well-controlled at home. Continue metoprolol Heart healthy low-sodium diet-salty 6 given Increase physical activity as tolerated  OSA-continues compliance with CPAP. Continue CPAP use  Lower extremity weakness/generalized weakness-enjoying chair exercises.  Continues with progressing lower extremity weakness.  Limited in his physical activity.  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 Increase physical activity as tolerated Following with PCP  Disposition: Follow-up with Dr. Sallyanne Kuster in 12 months.   Jossie Ng. Ivorie Uplinger NP-C    10/15/2021, 4:02 PM [facility] [place] [address] Office [phone] Fax [phone]  Notice: This dictation was prepared with Dragon dictation along with smaller phrase technology. Any transcriptional errors that result from this process are unintentional and  may not be corrected upon review.  I spent 14 minutes examining this patient, reviewing medications, and using patient centered shared decision making involving her cardiac care.  Prior to her visit I spent greater than 20 minutes reviewing her past medical history,  medications, and prior cardiac tests.

## 2021-10-15 ENCOUNTER — Encounter: Payer: Self-pay | Admitting: General Practice

## 2021-10-15 ENCOUNTER — Ambulatory Visit (INDEPENDENT_AMBULATORY_CARE_PROVIDER_SITE_OTHER): Payer: Medicare Other | Admitting: General Practice

## 2021-10-15 ENCOUNTER — Other Ambulatory Visit: Payer: Self-pay

## 2021-10-15 VITALS — BP 136/62 | HR 58 | Ht 67.0 in | Wt 163.0 lb

## 2021-10-15 DIAGNOSIS — R531 Weakness: Secondary | ICD-10-CM | POA: Diagnosis not present

## 2021-10-15 DIAGNOSIS — G4733 Obstructive sleep apnea (adult) (pediatric): Secondary | ICD-10-CM

## 2021-10-15 DIAGNOSIS — I10 Essential (primary) hypertension: Secondary | ICD-10-CM | POA: Diagnosis not present

## 2021-10-15 DIAGNOSIS — I2510 Atherosclerotic heart disease of native coronary artery without angina pectoris: Secondary | ICD-10-CM | POA: Diagnosis not present

## 2021-10-15 DIAGNOSIS — E782 Mixed hyperlipidemia: Secondary | ICD-10-CM

## 2021-10-15 DIAGNOSIS — Z9989 Dependence on other enabling machines and devices: Secondary | ICD-10-CM | POA: Diagnosis not present

## 2021-10-15 DIAGNOSIS — R0609 Other forms of dyspnea: Secondary | ICD-10-CM | POA: Diagnosis not present

## 2021-10-15 DIAGNOSIS — R0602 Shortness of breath: Secondary | ICD-10-CM

## 2021-10-15 DIAGNOSIS — M6281 Muscle weakness (generalized): Secondary | ICD-10-CM | POA: Diagnosis not present

## 2021-10-15 NOTE — Patient Instructions (Signed)
Medication Instructions:  Your Physician recommend you continue on your current medication as directed.    *If you need a refill on your cardiac medications before your next appointment, please call your pharmacy*   Lab Work: None    Testing/Procedures: None    Follow-Up: At [facility], you and your health needs are our priority.  As part of our continuing mission to provide you with exceptional heart care, we have created designated Provider Care Teams.  These Care Teams include your primary Cardiologist (physician) and Advanced Practice Providers (APPs -  Physician Assistants and Nurse Practitioners) who all work together to provide you with the care you need, when you need it.  We recommend signing up for the patient portal called "MyChart".  Sign up information is provided on this After Visit Summary.  MyChart is used to connect with patients for Virtual Visits (Telemedicine).  Patients are able to view lab/test results, encounter notes, upcoming appointments, etc.  Non-urgent messages can be sent to your provider as well.   To learn more about what you can do with MyChart, go to [email or web address].    Your next appointment:   12 month(s)  The format for your next appointment:   In Person  Provider:   Sanda Klein, MD    Other Instructions Continue with physical activity as tolerated.

## 2021-10-31 DIAGNOSIS — Z1152 Encounter for screening for COVID-19: Secondary | ICD-10-CM | POA: Diagnosis not present

## 2021-11-26 ENCOUNTER — Other Ambulatory Visit: Payer: Self-pay | Admitting: Cardiovascular Disease

## 2021-12-04 DIAGNOSIS — Z1152 Encounter for screening for COVID-19: Secondary | ICD-10-CM | POA: Diagnosis not present

## 2022-01-04 DIAGNOSIS — Z1152 Encounter for screening for COVID-19: Secondary | ICD-10-CM | POA: Diagnosis not present

## 2022-01-29 DIAGNOSIS — Z1152 Encounter for screening for COVID-19: Secondary | ICD-10-CM | POA: Diagnosis not present

## 2022-01-30 DIAGNOSIS — J069 Acute upper respiratory infection, unspecified: Secondary | ICD-10-CM | POA: Diagnosis not present

## 2022-03-01 DIAGNOSIS — Z1152 Encounter for screening for COVID-19: Secondary | ICD-10-CM | POA: Diagnosis not present

## 2022-03-15 DIAGNOSIS — M545 Low back pain, unspecified: Secondary | ICD-10-CM | POA: Diagnosis not present

## 2022-03-15 DIAGNOSIS — M542 Cervicalgia: Secondary | ICD-10-CM | POA: Diagnosis not present

## 2022-03-26 DIAGNOSIS — L821 Other seborrheic keratosis: Secondary | ICD-10-CM | POA: Diagnosis not present

## 2022-03-26 DIAGNOSIS — L570 Actinic keratosis: Secondary | ICD-10-CM | POA: Diagnosis not present

## 2022-03-26 DIAGNOSIS — Z85828 Personal history of other malignant neoplasm of skin: Secondary | ICD-10-CM | POA: Diagnosis not present

## 2022-03-26 DIAGNOSIS — C44629 Squamous cell carcinoma of skin of left upper limb, including shoulder: Secondary | ICD-10-CM | POA: Diagnosis not present

## 2022-03-26 DIAGNOSIS — D485 Neoplasm of uncertain behavior of skin: Secondary | ICD-10-CM | POA: Diagnosis not present

## 2022-04-01 ENCOUNTER — Other Ambulatory Visit: Payer: Self-pay | Admitting: *Deleted

## 2022-04-01 ENCOUNTER — Encounter: Payer: Self-pay | Admitting: *Deleted

## 2022-04-02 ENCOUNTER — Ambulatory Visit (INDEPENDENT_AMBULATORY_CARE_PROVIDER_SITE_OTHER): Payer: Medicare Other | Admitting: Diagnostic Neuroimaging

## 2022-04-02 ENCOUNTER — Encounter: Payer: Self-pay | Admitting: Diagnostic Neuroimaging

## 2022-04-02 VITALS — BP 126/68 | HR 65 | Ht 66.0 in | Wt 159.5 lb

## 2022-04-02 DIAGNOSIS — R799 Abnormal finding of blood chemistry, unspecified: Secondary | ICD-10-CM

## 2022-04-02 DIAGNOSIS — M6281 Muscle weakness (generalized): Secondary | ICD-10-CM | POA: Diagnosis not present

## 2022-04-02 NOTE — Progress Notes (Signed)
? ?[facility] ? ?PATIENT: Timothy Gates ?DOB: June 09, 1946 ? ?REFERRING CLINICIAN: Susa Day, MD ?HISTORY FROM: patient ?REASON FOR VISIT: new consult ? ? ? ?HISTORICAL ? ?CHIEF COMPLAINT:  ?Chief Complaint  ?Patient presents with  ? Balance, weakness  ?  Rm 2 New Pt  wife- Bethena Roys "frequent falls d/t legs giving out from under me, can't get myself up x 1-2 years, getting worse"   ? ? ?HISTORY OF PRESENT ILLNESS:  ? ?76 year old male with 1 year history of progressive gait mild difficulty, falls, weakness in arms and legs.  Has some numbness in his feet as well.  Went to cardiology for evaluation in July 2022 was noted to have elevated CK levels.  Recommend he had elevated CK levels also in January 2022.  At that time he was recommended to pursue myopathy labs and neurology consult in July 2022 but patient declined further evaluation.  Patient was encouraged to follow-up with [place] again and has presented today for evaluation. ? 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 ? ?In addition he has been on statin medication for several years.  Due to muscle weakness symptoms this was reduced and stopped for some time but symptoms did not improve. ? ? ? ?REVIEW OF SYSTEMS: Full 14 system review of systems performed and negative with exception of: as per HPI. ? ?ALLERGIES: ?Allergies  ?Allergen Reactions  ? Benadryl [Diphenhydramine Hcl] Other (See Comments)  ?  Headache  ? Meloxicam Itching  ? Tramadol Itching  ? ? ?HOME MEDICATIONS: ?Outpatient Medications Prior to Visit  ?Medication Sig Dispense Refill  ? Ascorbic Acid (VITAMIN C PO) Take by mouth.    ? aspirin 81 MG chewable tablet Chew 81 mg by mouth daily.    ? Cholecalciferol (VITAMIN D3) 1000 units CAPS Take 1,000 Units by mouth daily.    ? finasteride (PROSCAR) 5 MG tablet Take 1 tablet by mouth daily.    ?  meclizine (ANTIVERT) 25 MG tablet one tablet    ? metoprolol succinate (TOPROL-XL) 50 MG 24 hr tablet Take 25 mg by mouth daily after supper. Take with or immediately following a meal.    ? pantoprazole (PROTONIX) 40 MG tablet Take 40 mg by mouth daily.    ? rosuvastatin (CRESTOR) 20 MG tablet TAKE 1 TABLET BY MOUTH EVERY DAY 90 tablet 3  ? sertraline (ZOLOFT) 50 MG tablet Take 50 mg by mouth daily.    ? vitamin B-12 (CYANOCOBALAMIN) 1000 MCG tablet Take 1,000 mcg by mouth daily.    ? zolpidem (AMBIEN) 10 MG tablet Take 10 mg by mouth at bedtime.    ? ?No facility-administered medications prior to visit.  ? ? ?PAST MEDICAL HISTORY: ?Past Medical History:  ?Diagnosis Date  ? 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 Balance problem   ? CAD (coronary artery disease)   ? GERD (gastroesophageal reflux disease)   ? Hyperlipidemia   ? Iron deficiency anemia   ? Low back pain   ? OSA (obstructive sleep apnea)   ? RBBB (right bundle branch block) 10/28/2019  ? Noted on EKG  ? Tinnitus   ? Left ear  ? ? ?PAST SURGICAL HISTORY: ?Past Surgical History:  ?Procedure Laterality Date  ? CORONARY ARTERY BYPASS GRAFT  04/02/1999  ? double  ? DG BARIUM SWALLOW ([place] HX)    ? FISSURECTOMY  2006  ?  SHOULDER ARTHROSCOPY WITH ROTATOR CUFF REPAIR AND SUBACROMIAL DECOMPRESSION Left 03/22/2020  ? Procedure: RIGHT SHOULDER MINI OPEN ROTATOR CUFF REPAIR AND SUBACROMIAL DECOMPRESSION;  Surgeon: Susa Day, MD;  Location: [facility];  Service: Orthopedics;  Laterality: Left;  90 mins  ? SIGMOIDOSCOPY    ? UPPER GI ENDOSCOPY    ? ? ?FAMILY HISTORY: ?Family History  ?Problem Relation Age of Onset  ? Diabetes Mother   ? Cancer Father   ? Cancer Sister   ? Cancer Sister   ? Cancer Brother   ? Cancer Maternal Grandmother   ? Cancer Maternal Grandfather   ? Heart attack Paternal Grandmother   ? Heart attack Paternal Grandfather   ? ? ?SOCIAL HISTORY: ?Social History  ? ?Socioeconomic History  ? Marital status: Married  ?  Spouse name: Bethena Roys  ? Number of children: 2   ? Years of education: Not on file  ? Highest education level: 9th grade  ?Occupational History  ? Not on file  ?Tobacco Use  ? Smoking status: Former  ?  Types: Cigarettes  ?  Quit date: 38  ?  Years since quitting: 30.3  ? Smokeless tobacco: Former  ?  Quit date: 11/24/1992  ?Vaping Use  ? Vaping Use: Never used  ?Substance and Sexual Activity  ? Alcohol use: No  ? Drug use: No  ? Sexual activity: Not on file  ?Other Topics Concern  ? Not on file  ?Social History Narrative  ? 04/02/22 Lives with wife  ? ?Social Determinants of Health  ? ?Financial Resource Strain: Not on file  ?Food Insecurity: Not on file  ?Transportation Needs: Not on file  ?Physical Activity: Not on file  ?Stress: Not on file  ?Social Connections: Not on file  ?Intimate Partner Violence: Not on file  ? ? ? ?PHYSICAL EXAM ? ?GENERAL EXAM/CONSTITUTIONAL: ?Vitals:  ?Vitals:  ? 04/02/22 0926  ?BP: 126/68  ?Pulse: 65  ?Weight: 159 lb 8 oz (72.3 kg)  ?Height: '5\' 6"'$  (1.676 m)  ? ?Body mass index is 25.74 kg/m?. ?Wt Readings from Last 3 Encounters:  ?04/02/22 159 lb 8 oz (72.3 kg)  ?10/15/21 163 lb (73.9 kg)  ?09/18/21 161 lb 3.2 oz (73.1 kg)  ? ?Patient is in no distress; well developed, nourished and groomed; neck is supple ? ?CARDIOVASCULAR: ?Examination of carotid arteries is normal; no carotid bruits ?Regular rate and rhythm, no murmurs ?Examination of peripheral vascular system by observation and palpation is normal ? ?EYES: ?Ophthalmoscopic exam of optic discs and posterior segments is normal; no papilledema or hemorrhages ?No results found. ? ?MUSCULOSKELETAL: ?Gait, strength, tone, movements noted in Neurologic exam below ? ?NEUROLOGIC: ?MENTAL STATUS:  ?   ? View : No data to display.  ?  ?  ?  ? ?awake, alert, oriented to person, place and time ?recent and remote memory intact ?normal attention and concentration ?language fluent, comprehension intact, naming intact ?fund of knowledge appropriate ? ?CRANIAL NERVE:  ?2nd - no papilledema on  fundoscopic exam ?2nd, 3rd, 4th, 6th - pupils equal and reactive to light, visual fields full to confrontation, extraocular muscles intact, no nystagmus ?5th - facial sensation symmetric ?7th - facial strength symmetric ?8th - hearing intact ?9th - palate elevates symmetrically, uvula midline ?11th - shoulder shrug symmetric ?12th - tongue protrusion midline ? ?MOTOR:  ?DELTOIDS 4+ ?TRICEPS 3, BICEPS 4 ?GRIP 5 ?HIP FLEXION 3 ?KNEE EXT 5, KNEE FLEX 4+ ?DF 5 ? ?SENSORY:  ?normal and symmetric to light touch, temperature, vibration ? ?  COORDINATION:  ?finger-nose-finger, fine finger movements normal ? ?REFLEXES:  ?deep tendon reflexes 1+ and symmetric ? ?GAIT/STATION:  ?narrow based gait; SLOW AND CAUTIOUS; PUSHES UP TO STANDING USING HANDS; SHORT STEPS AND UNSTEADY ? ? ? ? ?DIAGNOSTIC DATA (LABS, IMAGING, TESTING) ?- I reviewed patient records, labs, notes, testing and imaging myself where available. ? ?Lab Results  ?Component Value Date  ? WBC 6.7 09/18/2021  ? HGB 12.4 (L) 09/18/2021  ? HCT 37.8 09/18/2021  ? MCV 83 09/18/2021  ? PLT 154 09/18/2021  ? ?   ?Component Value Date/Time  ? NA 140 09/18/2021 1553  ? K 4.8 09/18/2021 1553  ? CL 102 09/18/2021 1553  ? CO2 23 09/18/2021 1553  ? GLUCOSE 94 09/18/2021 1553  ? GLUCOSE 122 (H) 03/13/2020 1034  ? BUN 17 09/18/2021 1553  ? CREATININE 0.77 09/18/2021 1553  ? CALCIUM 9.4 09/18/2021 1553  ? GFRNONAA >60 03/13/2020 1034  ? GFRAA >60 03/13/2020 1034  ? ?Lab Results  ?Component Value Date  ? CHOL 115 06/01/2021  ? HDL 27 (L) 06/01/2021  ? [place] 60 06/01/2021  ? TRIG 166 (H) 06/01/2021  ? CHOLHDL 4.3 06/01/2021  ? ?No results found for: HGBA1C ?No results found for: VITAMINB12 ?Lab Results  ?Component Value Date  ? TSH 2.320 12/05/2020  ? ?Total CK  ?Date Value Ref Range Status  ?06/01/2021 731 (HH) 41 - 331 U/L Final  ?12/05/2020 564 (HH) 41 - 331 U/L Final  ? ? ? ?ASSESSMENT AND PLAN ? ?76 y.o. year old male here with: ? ? ?Dx: ? ?1. Muscle weakness   ?2. Abnormal  finding of blood chemistry, unspecified   ? ? ? ?PLAN: ? 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

## 2022-04-02 NOTE — Patient Instructions (Signed)
MUSCLE WEAKNESS (increased CK; suspect myopathy; also some neuropathy sxs) ?- check labs and EMG/NCS ?- then may need muscle biopsy ?- use cane / walker; consider PT evaluation ?

## 2022-04-03 ENCOUNTER — Telehealth: Payer: Self-pay | Admitting: Neurology

## 2022-04-03 DIAGNOSIS — M6281 Muscle weakness (generalized): Secondary | ICD-10-CM

## 2022-04-03 NOTE — Telephone Encounter (Signed)
Higher CK now 1209,  ? ?Ordered EMG/NCS. ? ?Orders Placed This Encounter  ?Procedures  ? NCV with EMG(electromyography)  ? ?  Please put him on my schedule soon, 60 minutes slot, it is ok to overbook ?

## 2022-04-04 NOTE — Telephone Encounter (Signed)
I spoke to the patient and scheduled him for 04/17/22.  ?

## 2022-04-04 NOTE — Telephone Encounter (Signed)
Called patient and informed him that his CK lab remains elevated. Dr Leta Baptist has sent note to Dr Krista Blue who ordered a EMG/NCS. Dr Krista Blue will do test.  I advised he'll get a call to schedule it. Patient verbalized understanding, appreciation. ? ?

## 2022-04-14 LAB — CBC WITH DIFFERENTIAL/PLATELET
Basophils Absolute: 0 10*3/uL (ref 0.0–0.2)
Basos: 0 %
EOS (ABSOLUTE): 0.2 10*3/uL (ref 0.0–0.4)
Eos: 3 %
Hematocrit: 37.6 % (ref 37.5–51.0)
Hemoglobin: 12 g/dL — ABNORMAL LOW (ref 13.0–17.7)
Immature Grans (Abs): 0 10*3/uL (ref 0.0–0.1)
Immature Granulocytes: 0 %
Lymphocytes Absolute: 1.3 10*3/uL (ref 0.7–3.1)
Lymphs: 21 %
MCH: 26.3 pg — ABNORMAL LOW (ref 26.6–33.0)
MCHC: 31.9 g/dL (ref 31.5–35.7)
MCV: 83 fL (ref 79–97)
Monocytes Absolute: 0.3 10*3/uL (ref 0.1–0.9)
Monocytes: 5 %
Neutrophils Absolute: 4.5 10*3/uL (ref 1.4–7.0)
Neutrophils: 71 %
Platelets: 159 10*3/uL (ref 150–450)
RBC: 4.56 x10E6/uL (ref 4.14–5.80)
RDW: 14.4 % (ref 11.6–15.4)
WBC: 6.3 10*3/uL (ref 3.4–10.8)

## 2022-04-14 LAB — VITAMIN B12: Vitamin B-12: 1468 pg/mL — ABNORMAL HIGH (ref 232–1245)

## 2022-04-14 LAB — COMPREHENSIVE METABOLIC PANEL
ALT: 50 IU/L — ABNORMAL HIGH (ref 0–44)
AST: 39 IU/L (ref 0–40)
Albumin/Globulin Ratio: 1.5 (ref 1.2–2.2)
Albumin: 4.2 g/dL (ref 3.7–4.7)
Alkaline Phosphatase: 50 IU/L (ref 44–121)
BUN/Creatinine Ratio: 16 (ref 10–24)
BUN: 14 mg/dL (ref 8–27)
Bilirubin Total: 0.5 mg/dL (ref 0.0–1.2)
CO2: 23 mmol/L (ref 20–29)
Calcium: 8.8 mg/dL (ref 8.6–10.2)
Chloride: 105 mmol/L (ref 96–106)
Creatinine, Ser: 0.86 mg/dL (ref 0.76–1.27)
Globulin, Total: 2.8 g/dL (ref 1.5–4.5)
Glucose: 117 mg/dL — ABNORMAL HIGH (ref 70–99)
Potassium: 4.1 mmol/L (ref 3.5–5.2)
Sodium: 142 mmol/L (ref 134–144)
Total Protein: 7 g/dL (ref 6.0–8.5)
eGFR: 90 mL/min/{1.73_m2} (ref >59)

## 2022-04-14 LAB — MUSK ANTIBODIES: MuSK Antibodies: <1 U/mL

## 2022-04-14 LAB — ANA,IFA RA DIAG PNL W/RFLX TIT/PATN
ANA Titer 1: NEGATIVE
Cyclic Citrullin Peptide Ab: 8 units (ref 0–19)
Rheumatoid fact SerPl-aCnc: 12.9 IU/mL (ref <14.0)

## 2022-04-14 LAB — ALDOLASE: Aldolase: 15.7 U/L — ABNORMAL HIGH (ref 3.3–10.3)

## 2022-04-14 LAB — HEMOGLOBIN A1C
Est. average glucose Bld gHb Est-mCnc: 134 mg/dL
Hgb A1c MFr Bld: 6.3 % — ABNORMAL HIGH (ref 4.8–5.6)

## 2022-04-14 LAB — CK: Total CK: 1209 U/L (ref 41–331)

## 2022-04-14 LAB — ANTI-JO 1 ANTIBODY, IGG: Anti JO-1: <0.2 AI (ref 0.0–0.9)

## 2022-04-14 LAB — TSH: TSH: 2.31 u[IU]/mL (ref 0.450–4.500)

## 2022-04-14 LAB — ACHR ABS WITH REFLEX TO MUSK: AChR Binding Ab, Serum: 0.11 nmol/L (ref 0.00–0.24)

## 2022-04-17 ENCOUNTER — Ambulatory Visit (INDEPENDENT_AMBULATORY_CARE_PROVIDER_SITE_OTHER): Payer: Medicare Other | Admitting: Neurology

## 2022-04-17 VITALS — BP 138/73 | HR 72

## 2022-04-17 DIAGNOSIS — M6281 Muscle weakness (generalized): Secondary | ICD-10-CM | POA: Diagnosis not present

## 2022-04-17 DIAGNOSIS — R269 Unspecified abnormalities of gait and mobility: Secondary | ICD-10-CM | POA: Diagnosis not present

## 2022-04-17 DIAGNOSIS — R799 Abnormal finding of blood chemistry, unspecified: Secondary | ICD-10-CM

## 2022-04-17 NOTE — Progress Notes (Addendum)
ASSESSMENT AND PLAN  Timothy Gates is a 76 y.o. 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  There is no evidence of intrinsic muscle disease,  Differentiation diagnosis include cervical spondylitic myelopathy with superimposed lumbar radiculopathy, motor neuron disease, infectious, nutritional deficiency, paraneoplastic syndrome etc. Elevated CPK could be due to gait abnormality/fall/strenuous activity,   DIAGNOSTIC DATA (LABS, IMAGING, TESTING) - I reviewed patient records, labs, notes, testing and imaging myself where available.   MEDICAL HISTORY:  ANTHEM FRAZER, is a 76 year old male, seen in referral by my colleague Dr. Andrey Spearman for evaluation of gait abnormality, EMG nerve conduction study, elevated CPK, his primary care physician is Dr.Pharr, Thayer Jew, MD  I reviewed and summarized the referring note.PMHX HLD Anxiety CAD, s/p CABG 2000, HTN Chronic insomnia Left rotator cuff surgery.  He noticed gradual onset of unsteady gait since 2021, feeling imbalance, only mild numbness at the bottom of his feet, he denies finger numbness, right leg seems to give him more trouble,  He was initially found to have mild elevated CPK in 2022, 731, repeat testing in May was 1209,  Extensive laboratory evaluation otherwise normal, including normal negative ANA, Jo 1 antibody, C-reactive protein, ESR, acetylcholine receptor antibody, ANA, TSH, B12, A1c was 6.3, normal  CMP, creatinine 11.7  He has occasional low back pain radiating pain to right lower extremity, denies significant neck pain, denies bowel or bladder incontinence  He denies double vision, droopy eyelid, swallowing difficulties  Known history of cervical degenerative changes, MRI of cervical spine in 2008 showed multilevel degenerative changes, most noticeable at lower cervical region, moderate right foraminal narrowing C6-7, C5-6, no evidence of cord compression  MRI of lumbar spine from orthopedic clinic reported multilevel foraminal stenosis  PHYSICAL EXAM:   Vitals:   04/17/22 0808  BP: 138/73  Pulse: 72   Not recorded     There is no height or weight on file to calculate BMI.  PHYSICAL EXAMNIATION:  Gen: NAD, conversant, well nourised, well groomed                     Cardiovascular:  Irregular rhythm Eyes: Conjunctivae clear without exudates or hemorrhage Neck: Supple, no carotid bruits. Pulmonary: Clear to auscultation bilaterally   NEUROLOGICAL EXAM:  MENTAL STATUS: Speech/cognition: Awake, alert, oriented to history taking and casual conversation CRANIAL NERVES: CN II: Visual fields are full to confrontation. Pupils are round equal and briskly reactive to light. CN III, IV, VI: extraocular movement are normal. No ptosis. CN V: Facial sensation is intact to light touch CN VII: Face is symmetric with normal eye closure  CN VIII: Hearing is normal to causal conversation. CN IX, X: Phonation is normal. CN XI: Head turning and shoulder shrug are intact  MOTOR: Mild bilateral elbow extension, finger abduction weakness, mild hip flexion weakness.  REFLEXES: Reflexes are 2 and symmetric at the biceps, triceps, 1/1 knees, and absent at ankles. Plantar responses are extensor bilaterally  SENSORY: Length-dependent decreased to light touch, pinprick to above ankle level, hairline receding to mid shin level,  COORDINATION: There is  no trunk or limb dysmetria  noted.  GAIT/STANCE: Need push-up to get up from seated position, stiff, small stride, cautious, unsteady, could not stand up on tiptoes or heels,  REVIEW OF SYSTEMS:  Full 14 system review of systems performed and notable only for as above All other review of systems were negative.   ALLERGIES: Allergies  Allergen Reactions   Benadryl [Diphenhydramine Hcl] Other (See Comments)    Headache   Meloxicam Itching   Tramadol Itching    HOME MEDICATIONS: Current Outpatient Medications  Medication Sig Dispense Refill   Ascorbic Acid (VITAMIN C PO) Take by mouth.     aspirin 81 MG chewable tablet Chew 81 mg by mouth daily.     Cholecalciferol (VITAMIN D3) 1000 units CAPS Take 1,000 Units by mouth daily.     finasteride (PROSCAR) 5 MG tablet Take 1 tablet by mouth daily.     meclizine (ANTIVERT) 25 MG tablet one tablet     metoprolol succinate (TOPROL-XL) 50 MG 24 hr tablet Take 25 mg by mouth daily after supper. Take with or immediately following a meal.     pantoprazole (PROTONIX) 40 MG tablet Take 40 mg by mouth daily.     rosuvastatin (CRESTOR) 20 MG tablet TAKE 1 TABLET BY MOUTH EVERY DAY 90 tablet 3   sertraline (ZOLOFT) 50 MG tablet Take 50 mg by mouth daily.     vitamin B-12 (CYANOCOBALAMIN) 1000 MCG tablet Take 1,000 mcg by mouth daily.     zolpidem (AMBIEN) 10 MG tablet Take 10 mg by mouth at bedtime.     No current facility-administered medications for this visit.    PAST MEDICAL HISTORY: Past Medical History:  Diagnosis Date   Arthritis    lower back   Balance problem    CAD (coronary artery disease)    GERD (gastroesophageal reflux disease)    Hyperlipidemia    Iron deficiency anemia    Low back pain    OSA (obstructive sleep apnea)    RBBB (right bundle branch block) 10/28/2019   Noted on EKG   Tinnitus    Left ear    PAST SURGICAL HISTORY: Past Surgical History:  Procedure Laterality Date   CORONARY ARTERY BYPASS GRAFT  04/02/1999   double   DG BARIUM  SWALLOW ([facility])     FISSURECTOMY  2006   SHOULDER ARTHROSCOPY WITH ROTATOR CUFF REPAIR AND SUBACROMIAL DECOMPRESSION Left 03/22/2020   Procedure: RIGHT SHOULDER MINI OPEN ROTATOR CUFF REPAIR AND SUBACROMIAL DECOMPRESSION;  Surgeon: Susa Day, MD;  Location: [facility];  Service: Orthopedics;  Laterality: Left;  90 mins   SIGMOIDOSCOPY     UPPER GI ENDOSCOPY      FAMILY HISTORY: Family History  Problem Relation Age of Onset   Diabetes Mother    Cancer Father    Cancer Sister    Cancer Sister    Cancer Brother    Cancer Maternal Grandmother    Cancer Maternal Grandfather    Heart attack Paternal Grandmother    Heart attack Paternal Grandfather     SOCIAL HISTORY: Social History   Socioeconomic History   Marital status: Married    Spouse name: Bethena Roys   Number of children: 2   Years of education: Not on file   Highest education level: 9th grade  Occupational History   Not on file  Tobacco Use   Smoking status: Former    Types: Cigarettes    Quit date: 1993    Years since quitting: 30.4  Smokeless tobacco: Former    Quit date: 11/24/1992  Vaping Use   Vaping Use: Never used  Substance and Sexual Activity   Alcohol use: No   Drug use: No   Sexual activity: Not on file  Other Topics Concern   Not on file  Social History Narrative   04/02/22 Lives with wife   Social Determinants of Health   Financial Resource Strain: Not on file  Food Insecurity: Not on file  Transportation Needs: Not on file  Physical Activity: Not on file  Stress: Not on file  Social Connections: Not on file  Intimate Partner Violence: Not on file      Marcial Pacas, M.D. Ph.D.  [facility] [address], [place], [place] [address] Ph: [phone] Fax: [phone]  CC:  Deland Pretty, MD [facility] [place],  [place] [address]  Deland Pretty, MD

## 2022-04-17 NOTE — Procedures (Signed)
Full Name: Timothy Gates Gender: Male MRN #: [id number] Date of Birth: 20-Mar-1946    Visit Date: 04/17/2022 08:36 Age: 76 Years Examining Physician: Marcial Pacas Referring Physician: Alfonso Patten Height: 5 feet 6 inch History: 76 year old male presenting with gradual onset gait abnormality, bilateral lower extremity paresthesia  Summary of the test:  Nerve conduction study:  Right sural, superficial peroneal sensory responses were absent.  Left median sensory response was absent.  Bilateral 4 ulnar sensory response were normal.  Right median sensory response showed mildly prolonged distal latency with mildly decreased snap amplitude.  Right peroneal to EDB motor response was absent.  Right tibial motor responses showed significantly decreased CMAP amplitude, with mildly prolonged distal latency, moderately decreased conduction velocity.  Right tibial F-wave latency was mildly prolonged.  Bilateral ulnar motor responses were within normal limit, including distal latency, conduction velocity and F-wave latency.  Bilateral median motor responses showed mildly prolonged distal latency, slightly decreased CMAP amplitude, with normal conduction velocity.  Electromyography: Selected needle examinations were performed at bilatera lower and upper extremity muscles, bilateral lumbar sacral and cervical paraspinal muscles.  There is evidence of active neuropathic changes involving bilateral distal leg muscles, mainly bilateral L4-5 S1 myotomes, right worse than left; there is also evidence of active denervation at bilateral lumbar sacral paraspinal muscles, with evidence of complex motor unit potentials.  There is evidence of chronic neuropathic changes involving bilateral cervical myotomes, mainly involving distal upper extremity muscles, T1 C8, C7 more than C6, 5.  There is no spontaneous activity at bilateral cervical paraspinals.  There is no significant abnormality at right  sternocleidomastoid, and the right lower thoracic paraspinal muscles.  Conclusion: This is an abnormal study.  There is electrodiagnostic evidence of chronic bilateral lumbosacral radiculopathy, right worse than left, mainly involving bilateral L4-5 S1 myotomes.  In addition there is also evidence of chronic bilateral cervical radiculopathies, mainly involving the distal cervical myotomes.  This happened in the background of mild axonal sensorimotor polyneuropathy.  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    ---------------------------- Marcial Pacas, M.D. PhD  [facility] [address] [place], [place] [address] Tel: [phone] Fax: [phone]  Verbal informed consent was obtained from the patient, patient was informed of potential risk of procedure, including bruising, bleeding, hematoma formation, infection, muscle weakness, muscle pain, numbness, among others.        Vader    Nerve / Sites Muscle Latency Ref. Amplitude Ref. Rel Amp Segments Distance Velocity Ref. Area    ms ms mV mV %  cm m/s m/s mVms  R Median - APB     Wrist APB 4.7 ?4.4 3.0 ?4.0 100 Wrist - APB 7   11.3     Upper arm APB 8.7  1.9  63.1 Upper arm - Wrist 20 50 ?49 7.5  L Median - APB     Wrist APB 4.9 ?4.4 3.6 ?4.0 100 Wrist - APB 7   12.7     Upper arm APB 9.0  3.0  84.7 Upper arm - Wrist 23 56 ?49 11.7  R Ulnar - ADM     Wrist ADM 3.3 ?3.3 7.2 ?6.0 100 Wrist - ADM 9   19.5     B.Elbow ADM 5.6  6.6  91.4 B.Elbow - Wrist 15 64 ?49 19.3     A.Elbow ADM 8.5  6.0  91.5 A.Elbow - B.Elbow 16 55 ?49 18.1  L Ulnar -  ADM     Wrist ADM 3.4 ?3.3 7.0 ?6.0 100 Wrist - ADM 8   23.0     B.Elbow ADM 5.8  6.7  95.7 B.Elbow - Wrist 15 61 ?49 22.4     A.Elbow ADM 8.4  5.9  88.8 A.Elbow - B.Elbow 15 59 ?49 19.7  R Tibial - AH     Ankle AH 6.2 ?5.8 1.1 ?4.0 100 Ankle - AH 9   2.9     Pop fossa AH 18.5  0.8  68.2 Pop fossa - Ankle 44 36 ?41 2.3  R  Peroneal - EDB     Fib Head Tib Ant NR ?4.7 NR ?3.0 NR Fib Head - Tib Ant 10   NR                 SNC    Nerve / Sites Rec. Site Peak Lat Ref.  Amp Ref. Segments Distance    ms ms V V  cm  R Sural - Ankle (Calf)     Calf Ankle NR ?4.4 NR ?6 Calf - Ankle 14  R Superficial peroneal - Ankle     Lat leg Ankle NR ?4.4 NR ?6 Lat leg - Ankle 14  R Median - Orthodromic (Dig II, Mid palm)     Dig II Wrist 3.9 ?3.4 7 ?10 Dig II - Wrist 13  L Median - Orthodromic (Dig II, Mid palm)     Dig II Wrist NR ?3.4 NR ?10 Dig II - Wrist 13  R Ulnar - Orthodromic, (Dig V, Mid palm)     Dig V Wrist 2.6 ?3.1 10 ?5 Dig V - Wrist 11  L Ulnar - Orthodromic, (Dig V, Mid palm)     Dig V Wrist 2.6 ?3.1 6 ?5 Dig V - Wrist 55                 F  Wave    Nerve F Lat Ref.   ms ms  R Ulnar - ADM 29.9 ?32.0  R Tibial - AH 68.8 ?56.0  L Ulnar - ADM 27.8 ?32.0           EMG Summary Table    Spontaneous MUAP Recruitment  Muscle IA Fib PSW Fasc Other Amp Dur. Poly Pattern  L. Tibialis anterior Normal None None None _______ Normal Normal Normal Normal  L. Tibialis posterior Normal None None None _______ Normal Normal Normal Normal  L. Peroneus longus Increased None None None _______ Increased Increased 1+ Reduced  L. Vastus lateralis Increased None None None _______ Increased Increased 1+ Reduced  L. Gastrocnemius (Medial head) Increased 1+ None None _______ Increased Increased 1+ Reduced  R. Tibialis posterior Increased 1+ None None _______ Increased Increased 1+ Reduced  R. Tibialis anterior Increased 1+ 1+ None _______ Increased Increased 1+ Reduced  R. Peroneus longus Increased None None None _______ Increased Increased 1+ Reduced  R. Gastrocnemius (Medial head) Increased None None None _______ Increased Increased 1+ Reduced  R. Vastus lateralis Increased None None None _______ Increased Increased 1+ Reduced  R. Lumbar paraspinals (low) Increased 1+ 1+ None _______ Normal Normal Normal Normal  R. Lumbar  paraspinals (mid) Increased None None None _______ Normal Normal Normal Normal  L. Lumbar paraspinals (low) Increased 1+ 1+ None _______ Normal Normal Normal Normal  L. Lumbar paraspinals (mid) Increased None None None _______ Normal Normal Normal Normal  R. First dorsal interosseous Increased None None None _______ Increased Increased 1+ Reduced  R. Abductor pollicis brevis Increased  None None None _______ Increased Increased 1+ Reduced  R. Pronator teres Increased None None None _______ Normal Normal Normal Reduced  R. Brachioradialis Increased None None None _______ Normal Normal Normal Reduced  R. Biceps brachii Increased None None None _______ Increased Increased 1+ Reduced  R. Deltoid Normal None None None _______ Normal Normal Normal Reduced  R. Triceps brachii Increased 1+ None None _______ Increased Increased 1+ Reduced  L. First dorsal interosseous Increased None None None _______ Increased Increased 1+ Reduced  L. Abductor pollicis brevis Increased None None None _______ Increased Increased 1+ Reduced  L. Pronator teres Normal None None None _______ Normal Normal Normal Reduced  L. Biceps brachii Normal None None None _______ Normal Normal Normal Reduced  L. Deltoid Normal None None None _______ Normal Normal Normal Reduced  L. Triceps brachii Increased None None None _______ Increased Increased 1+ Reduced  L. Cervical paraspinals Normal None None None _______ Normal Normal Normal Normal  R. Cervical paraspinals Normal None None None _______ Normal Normal Normal Normal  R. Thoracic paraspinals (low) Increased None None None _______ Normal Normal Normal Normal  R. Sternocleidomastoid Normal None None None _______ Normal Normal Normal Normal

## 2022-04-18 DIAGNOSIS — I10 Essential (primary) hypertension: Secondary | ICD-10-CM | POA: Diagnosis not present

## 2022-04-18 DIAGNOSIS — R5383 Other fatigue: Secondary | ICD-10-CM | POA: Diagnosis not present

## 2022-04-18 DIAGNOSIS — Z125 Encounter for screening for malignant neoplasm of prostate: Secondary | ICD-10-CM | POA: Diagnosis not present

## 2022-04-18 DIAGNOSIS — E7800 Pure hypercholesterolemia, unspecified: Secondary | ICD-10-CM | POA: Diagnosis not present

## 2022-04-19 DIAGNOSIS — R269 Unspecified abnormalities of gait and mobility: Secondary | ICD-10-CM | POA: Insufficient documentation

## 2022-04-19 DIAGNOSIS — M6281 Muscle weakness (generalized): Secondary | ICD-10-CM | POA: Insufficient documentation

## 2022-04-19 DIAGNOSIS — R799 Abnormal finding of blood chemistry, unspecified: Secondary | ICD-10-CM | POA: Insufficient documentation

## 2022-04-24 DIAGNOSIS — I2510 Atherosclerotic heart disease of native coronary artery without angina pectoris: Secondary | ICD-10-CM | POA: Diagnosis not present

## 2022-04-24 DIAGNOSIS — Z8639 Personal history of other endocrine, nutritional and metabolic disease: Secondary | ICD-10-CM | POA: Diagnosis not present

## 2022-04-24 DIAGNOSIS — I10 Essential (primary) hypertension: Secondary | ICD-10-CM | POA: Diagnosis not present

## 2022-04-24 DIAGNOSIS — F5101 Primary insomnia: Secondary | ICD-10-CM | POA: Diagnosis not present

## 2022-04-24 DIAGNOSIS — D649 Anemia, unspecified: Secondary | ICD-10-CM | POA: Diagnosis not present

## 2022-04-24 DIAGNOSIS — E785 Hyperlipidemia, unspecified: Secondary | ICD-10-CM | POA: Diagnosis not present

## 2022-04-24 DIAGNOSIS — Z125 Encounter for screening for malignant neoplasm of prostate: Secondary | ICD-10-CM | POA: Diagnosis not present

## 2022-04-24 DIAGNOSIS — Z0000 Encounter for general adult medical examination without abnormal findings: Secondary | ICD-10-CM | POA: Diagnosis not present

## 2022-04-24 DIAGNOSIS — R7301 Impaired fasting glucose: Secondary | ICD-10-CM | POA: Diagnosis not present

## 2022-04-25 ENCOUNTER — Telehealth: Payer: Self-pay | Admitting: Neurology

## 2022-04-25 ENCOUNTER — Telehealth: Payer: Self-pay | Admitting: Diagnostic Neuroimaging

## 2022-04-25 DIAGNOSIS — M6281 Muscle weakness (generalized): Secondary | ICD-10-CM

## 2022-04-25 DIAGNOSIS — R269 Unspecified abnormalities of gait and mobility: Secondary | ICD-10-CM

## 2022-04-25 DIAGNOSIS — R799 Abnormal finding of blood chemistry, unspecified: Secondary | ICD-10-CM

## 2022-04-25 NOTE — Telephone Encounter (Signed)
Please result  

## 2022-04-25 NOTE — Telephone Encounter (Signed)
Pt is asking for a call from RN of Dr Leta Baptist re: results from the testing ran by Dr Krista Blue

## 2022-04-25 NOTE — Addendum Note (Signed)
Addended by: Marcial Pacas on: 04/25/2022 02:01 PM   Modules accepted: Orders

## 2022-04-25 NOTE — Telephone Encounter (Signed)
Please call patient, I have discussed his case with Dr. Leta Baptist, I will continue to work with him to try and find the etiology of his gait abnormality   I ordred MRIs Orders Placed This Encounter  Procedures   MR CERVICAL SPINE [place]    Also repeat CPK level,   Please give him a follow up visit at my next avaiable

## 2022-04-25 NOTE — Telephone Encounter (Signed)
medicare NPR sent to GI  

## 2022-04-25 NOTE — Telephone Encounter (Signed)
I spoke to the patient. He is agreeable to move forward with this plan. He will come in for labs next week. Aware to expect a call for MRI scheduling. Follow up appt on 05/16/22 w/ Dr. Krista Blue.

## 2022-04-29 ENCOUNTER — Other Ambulatory Visit (INDEPENDENT_AMBULATORY_CARE_PROVIDER_SITE_OTHER): Payer: Self-pay

## 2022-04-29 DIAGNOSIS — R269 Unspecified abnormalities of gait and mobility: Secondary | ICD-10-CM

## 2022-04-29 DIAGNOSIS — M6281 Muscle weakness (generalized): Secondary | ICD-10-CM | POA: Diagnosis not present

## 2022-04-29 DIAGNOSIS — R799 Abnormal finding of blood chemistry, unspecified: Secondary | ICD-10-CM | POA: Diagnosis not present

## 2022-04-29 DIAGNOSIS — Z0289 Encounter for other administrative examinations: Secondary | ICD-10-CM

## 2022-04-29 NOTE — Progress Notes (Unsigned)
Cardiology Clinic Note   Patient Name: Timothy Gates Date of Encounter: 05/01/2022  Primary Care Provider:  Deland Pretty, MD Primary Cardiologist:  Sanda Klein, MD  Patient Profile    Timothy Gates 76 year old male presents the clinic today for follow-up for evaluation of his shortness of breath.  Past Medical History    Past Medical History:  Diagnosis Date   Arthritis    lower back   Balance problem    CAD (coronary artery disease)    GERD (gastroesophageal reflux disease)    Hyperlipidemia    Iron deficiency anemia    Low back pain    OSA (obstructive sleep apnea)    RBBB (right bundle branch block) 10/28/2019   Noted on EKG   Tinnitus    Left ear   Past Surgical History:  Procedure Laterality Date   CORONARY ARTERY BYPASS GRAFT  04/02/1999   double   DG BARIUM SWALLOW ([facility] HX)     FISSURECTOMY  2006   SHOULDER ARTHROSCOPY WITH ROTATOR CUFF REPAIR AND SUBACROMIAL DECOMPRESSION Left 03/22/2020   Procedure: RIGHT SHOULDER MINI OPEN ROTATOR CUFF REPAIR AND SUBACROMIAL DECOMPRESSION;  Surgeon: Susa Day, MD;  Location: [facility];  Service: Orthopedics;  Laterality: Left;  90 mins   SIGMOIDOSCOPY     UPPER GI ENDOSCOPY      Allergies  Allergies  Allergen Reactions   Benadryl [Diphenhydramine Hcl] Other (See Comments)    Headache   Meloxicam Itching   Tramadol Itching    History of Present Illness    Timothy Gates has a PMH of coronary artery disease, OSA on CPAP, hyperlipidemia, and dyslipidemia.  He underwent CABG with a LIMA-LAD by Dr. Roxan Hockey in 2000.  He previously felt that statins were causing him to have weakness.  His statins were held for 3 months and he continued to have substantial problems with weakness in his arms and legs.  He previously has had trouble getting up from the floor or from the ground after gardening.  This was felt to be due to weak leg muscles.  He described having to call himself up with his arms.  He has  previously not been able to get up out of the chair without pushing on the armrests.  There was not significant difference noted after his 60-monthhold of pravastatin.  While on pravastatin he was noted to have an LDL of 107.  He followed up with Dr. CSallyanne Kuster4/8/22.  He denied further problems with fine motor strength or diplopia.  He denied muscle pain or skin rashes.  He had gained around 4 pounds over the previous year.  He discontinued iron due to constipation.  He was previously told by his PCP that he was borderline anemic.  He presented to the clinic 10/01/2021 for follow-up evaluation states he had noticed increased shortness of breath and dyspnea with exertion while doing activities such as bending over and yard work.  He was limiting his physical activity due to his progressing lower extremity weakness.  We reviewed his previous borderline anemia.  He reported that he could eat more iron in his diet.  We reviewed iron rich foods.  I recommended that he start preparing foods in cast iron pans.  He denied bleeding issues.  We reviewed his lab work from 7/22.  I ordered a CBC, BMP, echocardiogram, gave him chair exercises, and planned follow-up for after his echocardiogram.  His echocardiogram 10/01/2021 showed an LVEF of 60-65%, intermediate diastolic parameters, mild mitral valve  regurgitation, and moderate mitral annular calcification with no stenosis.  No other significant valvular abnormalities were noted.  He presented to the clinic 10/15/2021 for follow-up evaluation stated he felt well .  He feltthat his breathing had improved.  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  He planned to plant flowers  with the nicer weather.  We reviewed his echocardiogram and lab work.  He expressed understanding.  I will planned follow-up for 12 months and as needed.  He presents to the clinic today for follow-up evaluation states he feels well.  He is enjoying the warm  weather and looking forward to the rain this afternoon.  He reports that he has been walking some and has been using his self-propelled push mower.  He has also been bleeding.  He continues to notice lower extremity weakness.  He has been following up with neurology.  There is an MRI planned of his lower back and hip later this week.  He reports that during his evaluation with neurology there was concern with an extra heartbeat.  His EKG today shows normal sinus rhythm possible left atrial enlargement right bundle branch block.  We reviewed his previous echocardiogram.  I will continue his current medication regimen, have him continue to increase his physical activity as tolerated, and plan follow-up in 1 year.  Today he denies chest pain,  lower extremity edema, palpitations, melena, hematuria, hemoptysis, diaphoresis, weakness, presyncope, syncope, orthopnea, and PND.  Home Medications    Prior to Admission medications   Medication Sig Start Date End Date Taking? Authorizing Provider  aspirin 81 MG chewable tablet Chew 81 mg by mouth daily.    [provider]  Cholecalciferol (VITAMIN D3) 1000 units CAPS Take 1,000 Units by mouth daily.    [provider]  finasteride (PROSCAR) 5 MG tablet Take 5 mg by mouth daily.    [provider]  meclizine (ANTIVERT) 25 MG tablet one tablet 11/30/14   [provider]  metoprolol succinate (TOPROL-XL) 50 MG 24 hr tablet Take 25 mg by mouth daily after supper. Take with or immediately following a meal.    [provider]  pantoprazole (PROTONIX) 40 MG tablet Take 40 mg by mouth daily.    [provider]  rosuvastatin (CRESTOR) 20 MG tablet Take 1 tablet (20 mg total) by mouth daily. 03/02/21 05/31/21  Croitoru, Mihai, MD  sertraline (ZOLOFT) 50 MG tablet Take 50 mg by mouth daily.    [provider]  vitamin B-12 (CYANOCOBALAMIN) 1000 MCG tablet Take 1,000 mcg by mouth daily.    [provider]   zolpidem (AMBIEN) 10 MG tablet Take 10 mg by mouth at bedtime.    [provider]    Family History    Family History  Problem Relation Age of Onset   Diabetes Mother    Cancer Father    Cancer Sister    Cancer Sister    Cancer Brother    Cancer Maternal Grandmother    Cancer Maternal Grandfather    Heart attack Paternal Grandmother    Heart attack Paternal Grandfather    He indicated that his mother is deceased. He indicated that his father is deceased. He indicated that only one of his six sisters is alive. He indicated that only one of his six brothers is alive. He indicated that his maternal grandmother is deceased. He indicated that his maternal grandfather is deceased. He indicated that his paternal grandmother is deceased. He  indicated that his paternal grandfather is deceased. He indicated that both of his sons are alive.   Social History    Social History   Socioeconomic History   Marital status: Married    Spouse name: Bethena Roys   Number of children: 2   Years of education: Not on file   Highest education level: 9th grade  Occupational History   Not on file  Tobacco Use   Smoking status: Former    Types: Cigarettes    Quit date: 1993    Years since quitting: 30.4   Smokeless tobacco: Former    Quit date: 11/24/1992  Vaping Use   Vaping Use: Never used  Substance and Sexual Activity   Alcohol use: No   Drug use: No   Sexual activity: Not on file  Other Topics Concern   Not on file  Social History Narrative   04/02/22 Lives with wife   Social Determinants of Health   Financial Resource Strain: Not on file  Food Insecurity: Not on file  Transportation Needs: Not on file  Physical Activity: Not on file  Stress: Not on file  Social Connections: Not on file  Intimate Partner Violence: Not on file     Review of Systems    General:  No chills, fever, night sweats or weight changes.  Cardiovascular:  No chest pain, dyspnea on exertion, edema,  orthopnea, palpitations, paroxysmal nocturnal dyspnea. Dermatological: No rash, lesions/masses Respiratory: No cough, dyspnea Urologic: No hematuria, dysuria Abdominal:   No nausea, vomiting, diarrhea, bright red blood per rectum, melena, or hematemesis Neurologic:  No visual changes, generalized weakness bilateral upper and lower extremities, changes in mental status. All other systems reviewed and are otherwise negative except as noted above.  Physical Exam    VS:  BP 138/72   Pulse 66   Ht '5\' 6"'$  (1.676 m)   Wt 161 lb 9.6 oz (73.3 kg)   SpO2 98%   BMI 26.08 kg/m  , BMI Body mass index is 26.08 kg/m. GEN: Well nourished, well developed, in no acute distress. HEENT: normal. Neck: Supple, no JVD, carotid bruits, or masses. Cardiac: RRR, no murmurs, rubs, or gallops. No clubbing, cyanosis, edema.  Radials/DP/PT 2+ and equal bilaterally.  Respiratory:  Respirations regular and unlabored, clear to auscultation bilaterally. GI: Soft, nontender, nondistended, BS + x 4. MS: no deformity or atrophy. Skin: warm and dry, no rash. Neuro:  Strength and sensation are intact. Psych: Normal affect.  Accessory Clinical Findings    Recent Labs: 04/02/2022: ALT 50; BUN 14; Creatinine, Ser 0.86; Hemoglobin 12.0; Platelets 159; Potassium 4.1; Sodium 142; TSH 2.310   Recent Lipid Panel    Component Value Date/Time   CHOL 115 06/01/2021 0813   TRIG 166 (H) 06/01/2021 0813   HDL 27 (L) 06/01/2021 0813   CHOLHDL 4.3 06/01/2021 0813   LDLCALC 60 06/01/2021 0813    ECG personally reviewed by me today-normal sinus rhythm possible left atrial enlargement right bundle branch block 65 bpm, no acute changes  EKG 10/15/2021 normal sinus rhythm right bundle branch block 64 bpm- No acute changes  Nuclear stress test 08/09/2016  The left ventricular ejection fraction is normal (55-65%). Nuclear stress EF: 58%. Septal wall hypokinesis, post CABG Horizontal ST segment depression ST segment depression of  1 mm was noted during stress in the V5 leads. This is a low risk study. No ischemia identified. No perfusion defects.   Candee Furbish, MD  Echocardiogram 10/01/2021 IMPRESSIONS     1. Left ventricular  ejection fraction, by estimation, is 60 to 65%. The  left ventricle has normal function. The left ventricle has no regional  wall motion abnormalities. There is mild concentric left ventricular  hypertrophy. Left ventricular diastolic  parameters are indeterminate.   2. Right ventricular systolic function is normal. The right ventricular  size is normal. Tricuspid regurgitation signal is inadequate for assessing  PA pressure.   3. The mitral valve is abnormal. Mild mitral valve regurgitation. No  evidence of mitral stenosis. Moderate mitral annular calcification.   4. The aortic valve is tricuspid. Aortic valve regurgitation is not  visualized. No aortic stenosis is present.   Comparison(s): No prior Echocardiogram.  Assessment & Plan   1.  Shortness of breath/DOE-stable/no increased DOE.  Previously reported increased work of breathing with increased physical activity.  Reported some decreased activity tolerance.    Denied bleeding issues.  Follow-up echocardiogram showed normal LVEF with intermediate diastolic function.  Details above.  Previously noted to have borderline anemia.  Follow-up lab work showed stable labs. Continue iron rich diet Increase physical activity as tolerated  Coronary artery disease-no chest pain today or recent episodes of arm neck back or chest discomfort.  Underwent CABG by Dr. Roxan Hockey in 2000.  Nuclear stress test 2017 showed EF 58%, no ischemia and was considered low risk. Continue rosuvastatin, metoprolol, aspirin, Heart healthy low-sodium diet-salty 6 given Increase physical activity as tolerated  Dyslipidemia-06/01/2021: Cholesterol, Total 115; HDL 27; LDL Chol Calc (NIH) 60; Triglycerides 166 previously stopped pravastatin due to muscle weakness.   Weakness symptoms did not improve after 41-monthold. Continue rosuvastatin Heart healthy low-sodium diet-salty 6 given Increase physical activity as tolerated Repeat fasting lipids and LFTs 7/23  Essential hypertension-BP today 138/72.  Well-controlled at home. Continue metoprolol Heart healthy low-sodium diet-salty 6 given Increase physical activity as tolerated  OSA-continues compliance with CPAP. Continue CPAP use  Lower extremity weakness/generalized weakness-has increased his physical activity.  Scheduled for low back and hip MRI later next week.  Following with neurology continues to have progressing lower extremity weakness.  Somewhat limited in his physical activity.  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 Increase physical activity as tolerated Following with neurology  Disposition: Follow-up with Dr. CSallyanne Kusterin 12 months.   JJossie Ng Jolan Upchurch NP-C    05/01/2022, [address] Office ([phone] [phone] Notice: This dictation was prepared with Dragon dictation along with smaller phrase technology. Any transcriptional errors that result from this process are unintentional and may not be corrected upon review.  I spent 14 minutes examining this patient, reviewing medications, and using patient centered shared decision making involving her cardiac care.  Prior to her visit I spent greater than 20 minutes reviewing her past medical history,  medications, and prior cardiac tests.

## 2022-05-01 ENCOUNTER — Ambulatory Visit (INDEPENDENT_AMBULATORY_CARE_PROVIDER_SITE_OTHER): Payer: Medicare Other | Admitting: General Practice

## 2022-05-01 ENCOUNTER — Encounter: Payer: Self-pay | Admitting: General Practice

## 2022-05-01 VITALS — BP 138/72 | HR 66 | Ht 66.0 in | Wt 161.6 lb

## 2022-05-01 DIAGNOSIS — I10 Essential (primary) hypertension: Secondary | ICD-10-CM | POA: Diagnosis not present

## 2022-05-01 DIAGNOSIS — R0602 Shortness of breath: Secondary | ICD-10-CM | POA: Diagnosis not present

## 2022-05-01 DIAGNOSIS — R531 Weakness: Secondary | ICD-10-CM

## 2022-05-01 DIAGNOSIS — I2510 Atherosclerotic heart disease of native coronary artery without angina pectoris: Secondary | ICD-10-CM

## 2022-05-01 DIAGNOSIS — Z9989 Dependence on other enabling machines and devices: Secondary | ICD-10-CM | POA: Diagnosis not present

## 2022-05-01 DIAGNOSIS — E782 Mixed hyperlipidemia: Secondary | ICD-10-CM

## 2022-05-01 DIAGNOSIS — R0609 Other forms of dyspnea: Secondary | ICD-10-CM | POA: Diagnosis not present

## 2022-05-01 DIAGNOSIS — G4733 Obstructive sleep apnea (adult) (pediatric): Secondary | ICD-10-CM

## 2022-05-01 NOTE — Patient Instructions (Signed)
Medication Instructions:  The current medical regimen is effective;  continue present plan and medications as directed. Please refer to the Current Medication list given to you today.   *If you need a refill on your cardiac medications before your next appointment, please call your pharmacy*  Lab Work:   Testing/Procedures:  NONE    NONE If you have labs (blood work) drawn today and your tests are completely normal, you will receive your results only by: [place] (if you have MyChart) OR  A paper copy in the mail If you have any lab test that is abnormal or we need to change your treatment, we will call you to review the results.  Follow-Up: Your next appointment:  12 month(s) In Person with Sanda Klein, MD    Please call our office 2 months in advance to schedule this appointment   At [facility], you and your health needs are our priority.  As part of our continuing mission to provide you with exceptional heart care, we have created designated Provider Care Teams.  These Care Teams include your primary Cardiologist (physician) and Advanced Practice Providers (APPs -  Physician Assistants and Nurse Practitioners) who all work together to provide you with the care you need, when you need it.    Important Information About Sugar

## 2022-05-02 LAB — LACTATE DEHYDROGENASE, ISOENZYMES
(LD) Fraction 1: 12 % — ABNORMAL LOW (ref 17–32)
(LD) Fraction 2: 47 % — ABNORMAL HIGH (ref 25–40)
(LD) Fraction 3: 27 % (ref 17–27)
(LD) Fraction 4: 9 % (ref 5–13)
(LD) Fraction 5: 5 % (ref 4–20)
LDH: 303 IU/L — ABNORMAL HIGH (ref 121–224)

## 2022-05-02 LAB — CK: Total CK: 1035 U/L (ref 41–331)

## 2022-05-06 ENCOUNTER — Telehealth: Payer: Self-pay | Admitting: *Deleted

## 2022-05-06 NOTE — Telephone Encounter (Signed)
R/c mri cd from [facility]. Dr Nickie Retort

## 2022-05-07 ENCOUNTER — Ambulatory Visit
Admission: RE | Admit: 2022-05-07 | Discharge: 2022-05-07 | Disposition: A | Discharge status: Home / Self Care | Payer: Medicare Other | Source: Ambulatory Visit | Attending: Neurology | Admitting: Neurology

## 2022-05-07 DIAGNOSIS — R269 Unspecified abnormalities of gait and mobility: Secondary | ICD-10-CM

## 2022-05-07 DIAGNOSIS — R799 Abnormal finding of blood chemistry, unspecified: Secondary | ICD-10-CM

## 2022-05-07 DIAGNOSIS — M6281 Muscle weakness (generalized): Secondary | ICD-10-CM

## 2022-05-16 ENCOUNTER — Ambulatory Visit (INDEPENDENT_AMBULATORY_CARE_PROVIDER_SITE_OTHER): Payer: Medicare Other | Admitting: Neurology

## 2022-05-16 ENCOUNTER — Encounter: Payer: Self-pay | Admitting: Neurology

## 2022-05-16 VITALS — BP 147/69 | HR 61 | Ht 66.0 in | Wt 161.0 lb

## 2022-05-16 DIAGNOSIS — I2510 Atherosclerotic heart disease of native coronary artery without angina pectoris: Secondary | ICD-10-CM | POA: Diagnosis not present

## 2022-05-16 DIAGNOSIS — M6281 Muscle weakness (generalized): Secondary | ICD-10-CM | POA: Diagnosis not present

## 2022-05-16 DIAGNOSIS — R269 Unspecified abnormalities of gait and mobility: Secondary | ICD-10-CM

## 2022-05-16 NOTE — Progress Notes (Signed)
ASSESSMENT AND PLAN  Timothy Gates is a 76 y.o. male   Gradual onset gait abnormality, Elevated CPK  MRI of cervical, lumbar spine showed no pathology to explain his current proximal lower and upper extremity weakness, gait abnormality,  Will refer to surgeon for left vastus lateralis muscle biopsy,  Repeat CPK, inflammatory markers,    DIAGNOSTIC DATA (LABS, IMAGING, TESTING) - I reviewed patient records, labs, notes, testing and imaging myself where available.   MEDICAL HISTORY:  EMIN FOREE, is a 76 year old male, seen in referral by my colleague Dr. Andrey Spearman for evaluation of gait abnormality, EMG nerve conduction study, elevated CPK, his primary care physician is Dr.Pharr, Thayer Jew, MD  I reviewed and summarized the referring note.PMHX HLD Anxiety CAD, s/p CABG 2000, HTN Chronic insomnia Left rotator cuff surgery.  He noticed gradual onset of unsteady gait since 2021, feeling imbalance, only mild numbness at the bottom of his feet, he denies finger numbness, right leg seems to give him more trouble,  He was initially found to have mild elevated CPK in 2022, 731, repeat testing in May was 1209,  Extensive laboratory evaluation otherwise normal, including normal negative ANA, Jo 1 antibody, C-reactive protein, ESR, acetylcholine receptor antibody, ANA, TSH, B12, A1c was 6.3, normal CMP, creatinine 11.7  He has occasional low back pain radiating pain to right lower extremity, denies significant neck pain, denies bowel or bladder incontinence  He denies double vision, droopy eyelid, swallowing difficulties  Known history of cervical degenerative changes, MRI of cervical spine in 2008 showed multilevel degenerative changes, most noticeable at lower cervical region, moderate right foraminal narrowing C6-7, C5-6, no evidence of cord compression  MRI of lumbar spine from orthopedic clinic reported multilevel foraminal stenosis  UPDATE May 16 2022: Continue  complains significant gait abnormality, has to get up from seated position, also noticed mild bilateral proximal upper extremity fairly symmetric weakness, no distal weakness,  We personally reviewed MRI of the cervical spine, on May 09, 2022, lower cervical degenerative changes no significant canal narrowing, no evidence of cord compression, 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  MRI of the lumbar spine, mild degenerative changes, no evidence of canal and foraminal narrowing  Extensive laboratory evaluation showed continued elevation of CPK 1035, LDH 303,, negative MuSK antibody, acetylcholine receptor antibody, thyroid functional test, CBC CMP, TSH, B12, ANA, anti-Jo1 antibody  I reviewed extensive medication list, he has been on different statin treatment since 2016, Pravachol, 40 mg, Crestor 20 mg since January 2023   He denies significant muscle achy pain  Repeat the needle examination of the right deltoid, vastus lateralis, right tibialis anterior, continue with evidence of normal morphology motor unit potential, with complex polyspike motor unit potential, with actually slight decrease including in patterns, I was not able to appreciate early recruitment, there was no typical small polyphasic myopathic motor unit potentials noted  PHYSICAL EXAM:   Vitals:   05/16/22 0844  BP: (!) 147/69  Pulse: 61  Weight: 161 lb (73 kg)  Height: '5\' 6"'  (1.676 m)     Body mass index is 25.99 kg/m.  PHYSICAL EXAMNIATION:  Gen: NAD, conversant, well nourised, well groomed                     Cardiovascular:  Irregular rhythm Eyes: Conjunctivae clear without exudates or hemorrhage Neck: Supple, no carotid bruits. Pulmonary: Clear to auscultation bilaterally   NEUROLOGICAL EXAM:  MENTAL STATUS: Speech/cognition: Awake, alert, oriented to history taking and casual conversation  CRANIAL NERVES: CN II: Visual fields are full to confrontation. Pupils are round equal and briskly reactive to  light. CN III, IV, VI: extraocular movement are normal. No ptosis. CN V: Facial sensation is intact to light touch CN VII: Face is symmetric with normal eye closure  CN VIII: Hearing is normal to causal conversation. CN IX, X: Phonation is normal. CN XI: Head turning and shoulder shrug are intact  MOTOR: Mild bilateral shoulder abduction, external rotation, elbow flexion, extension weakness, no distal arm weakness  Mild bilateral hip flexion weakness, no significant ankle dorsi plantarflexion weakness,  REFLEXES: Reflexes are 2 and symmetric at the biceps, triceps, 1/1 knees, and absent at ankles. Plantar responses are flexor bilaterally  SENSORY: Length-dependent decreased to light touch, pinprick to above ankle level, hairline receding to mid shin level,  COORDINATION: There is no trunk or limb dysmetria noted.  GAIT/STANCE: He need push-up to get up from seated position, could not get up from seated position arm crossed, cautious, stiff,  REVIEW OF SYSTEMS:  Full 14 system review of systems performed and notable only for as above All other review of systems were negative.   ALLERGIES: Allergies  Allergen Reactions   Benadryl [Diphenhydramine Hcl] Other (See Comments)    Headache   Meloxicam Itching   Tramadol Itching    HOME MEDICATIONS: Current Outpatient Medications  Medication Sig Dispense Refill   Ascorbic Acid (VITAMIN C PO) Take by mouth.     aspirin 81 MG chewable tablet Chew 81 mg by mouth daily.     Cholecalciferol (VITAMIN D3) 1000 units CAPS Take 1,000 Units by mouth daily.     finasteride (PROSCAR) 5 MG tablet Take 1 tablet by mouth daily.     meclizine (ANTIVERT) 25 MG tablet one tablet     metoprolol succinate (TOPROL-XL) 50 MG 24 hr tablet Take 25 mg by mouth daily after supper. Take with or immediately following a meal.     pantoprazole (PROTONIX) 40 MG tablet Take 40 mg by mouth daily.     rosuvastatin (CRESTOR) 20 MG tablet TAKE 1 TABLET BY MOUTH  EVERY DAY 90 tablet 3   sertraline (ZOLOFT) 50 MG tablet Take 50 mg by mouth daily.     vitamin B-12 (CYANOCOBALAMIN) 1000 MCG tablet Take 1,000 mcg by mouth daily.     zolpidem (AMBIEN) 10 MG tablet Take 10 mg by mouth at bedtime.     No current facility-administered medications for this visit.    PAST MEDICAL HISTORY: Past Medical History:  Diagnosis Date   Arthritis    lower back   Balance problem    CAD (coronary artery disease)    GERD (gastroesophageal reflux disease)    Hyperlipidemia    Iron deficiency anemia    Low back pain    OSA (obstructive sleep apnea)    RBBB (right bundle branch block) 10/28/2019   Noted on EKG   Tinnitus    Left ear    PAST SURGICAL HISTORY: Past Surgical History:  Procedure Laterality Date   CORONARY ARTERY BYPASS GRAFT  04/02/1999   double   DG BARIUM SWALLOW ([facility] HX)     FISSURECTOMY  2006   SHOULDER ARTHROSCOPY WITH ROTATOR CUFF REPAIR AND SUBACROMIAL DECOMPRESSION Left 03/22/2020   Procedure: RIGHT SHOULDER MINI OPEN ROTATOR CUFF REPAIR AND SUBACROMIAL DECOMPRESSION;  Surgeon: Susa Day, MD;  Location: [facility];  Service: Orthopedics;  Laterality: Left;  90 mins   SIGMOIDOSCOPY     UPPER GI ENDOSCOPY  FAMILY HISTORY: Family History  Problem Relation Age of Onset   Diabetes Mother    Cancer Father    Cancer Sister    Cancer Sister    Cancer Brother    Cancer Maternal Grandmother    Cancer Maternal Grandfather    Heart attack Paternal Grandmother    Heart attack Paternal Grandfather     SOCIAL HISTORY: Social History   Socioeconomic History   Marital status: Married    Spouse name: Bethena Roys   Number of children: 2   Years of education: Not on file   Highest education level: 9th grade  Occupational History   Not on file  Tobacco Use   Smoking status: Former    Types: Cigarettes    Quit date: 1993    Years since quitting: 30.4   Smokeless tobacco: Former    Quit date: 11/24/1992  Vaping Use   Vaping Use:  Never used  Substance and Sexual Activity   Alcohol use: No   Drug use: No   Sexual activity: Not on file  Other Topics Concern   Not on file  Social History Narrative   04/02/22 Lives with wife   Social Determinants of Health   Financial Resource Strain: Not on file  Food Insecurity: Not on file  Transportation Needs: Not on file  Physical Activity: Not on file  Stress: Not on file  Social Connections: Not on file  Intimate Partner Violence: Not on file      Marcial Pacas, M.D. Ph.D.  [facility] [address], [place], [place] [address] Ph: [phone] Fax: [phone]  CC:  Deland Pretty, MD [address],  [place] [address]  Deland Pretty, MD

## 2022-05-20 ENCOUNTER — Telehealth: Payer: Self-pay | Admitting: Neurology

## 2022-05-20 NOTE — Telephone Encounter (Signed)
Referral for General Surgery sent to [facility] [phone]. 

## 2022-05-22 ENCOUNTER — Telehealth: Payer: Self-pay | Admitting: Neurology

## 2022-05-22 DIAGNOSIS — R799 Abnormal finding of blood chemistry, unspecified: Secondary | ICD-10-CM

## 2022-05-22 DIAGNOSIS — R269 Unspecified abnormalities of gait and mobility: Secondary | ICD-10-CM

## 2022-05-22 DIAGNOSIS — M6281 Muscle weakness (generalized): Secondary | ICD-10-CM

## 2022-05-22 NOTE — Telephone Encounter (Signed)
Pt's wife states pt's legs are worse, she states he is unable to balance. Wife asking for some kind of medication, pt states his legs feel like rubber. Wife is unable to get pt back to the office for an appointment, she is asking for something to be called in for leg weakness and pain, please call.

## 2022-05-22 NOTE — Telephone Encounter (Signed)
Referred to [facility] for left vastus lateralis muscle biopsy to rule out inflammatory myopathy, elevated CPK up to 1200, proximal upper and lower extremity weakness, gait abnormality. Sent 05/16/22.  He does not have an appt for the procedure yet. I spoke to the referral team at [facility] [phone], option 1 for providers). I was informed it takes 5-7 business days for the referral to be placed in their system. He should be getting a call no later than next week to be scheduled.  I spoke to his wife who reports his lower extremity weakness is worse. He is unable to walk at this time. Says she is extremely concerned and asking for a call from Dr. Krista Blue.

## 2022-05-23 NOTE — Telephone Encounter (Signed)
He will see surgeon Dr. Antony Haste on July 18th for initial consultation,   He fell couple days ago, but overall there was no significant change in his weakness  I have discussed with patient, we can potentially  proceed with prednisone treatment without muscle sample out, he decided to wait until muscle biopsy before proceeding with any treatment

## 2022-05-25 LAB — LACTATE DEHYDROGENASE: LDH: 340 IU/L — ABNORMAL HIGH (ref 121–224)

## 2022-05-25 LAB — C-REACTIVE PROTEIN: CRP: 1 mg/L (ref 0–10)

## 2022-05-25 LAB — SEDIMENTATION RATE: Sed Rate: 5 mm/hr (ref 0–30)

## 2022-05-25 LAB — CK: Total CK: 1045 U/L (ref 41–331)

## 2022-05-25 LAB — ANTI-HMGCR AB (RDL): Anti-HMGCR Ab (RDL)^: <20 Units (ref <20)

## 2022-05-27 NOTE — Telephone Encounter (Signed)
Reminder set

## 2022-06-11 ENCOUNTER — Ambulatory Visit: Payer: Self-pay | Admitting: Surgery

## 2022-06-11 DIAGNOSIS — M6281 Muscle weakness (generalized): Secondary | ICD-10-CM | POA: Diagnosis not present

## 2022-06-11 NOTE — H&P (View-Only) (Signed)
History of Present Illness: Timothy Gates is a 76 y.o. male who was referred to me for evaluation for a muscle biopsy. He has had gradual onset of unsteady gait since 2021. He has difficulty standing up and has had some falls recently. He was referred to neurology and workup showed persistently elevated CPK levels. He recently had an MRI of the lumbar and cervical spine, which did not show an etiology of the symptoms. He was referred by his neurologist for a muscle biopsy of the left vastus lateralis.      He has a history of CAD and had a CABG in 2000. His last echo in Nov 2022 showed a normal LVEF with mild mitral regurgitation. He recently saw his cardiologist on 05/01/22 and will follow up again in 1 year. He was not having any chest pain at this visit and denies chest pain today.       Review of Systems: A complete review of systems was obtained from the patient.  I have reviewed this information and discussed as appropriate with the patient.  See HPI as well for other ROS.     Medical History: Past Medical HistoryExpand by Default Past Medical History: Diagnosis Date  Arthritis    Heart valve disease    Hyperlipidemia    Hypertension    Sleep apnea        Patient Active Problem List Diagnosis  Proximal muscle weakness     Past Surgical History Past Surgical History: Procedure Laterality Date  open heart surgery          Allergies Allergies Allergen Reactions  Diphenhydramine Hcl Other (See Comments)     Headache      Current Outpatient Medications on File Prior to Visit Medication Sig Dispense Refill  rosuvastatin (CRESTOR) 20 MG tablet Take 1 tablet by mouth once daily      aspirin 81 MG chewable tablet 81 mg by oral route.      cholecalciferol (VITAMIN D3) 1000 unit capsule 1000 units by oral route.      cyanocobalamin (VITAMIN B12) 1000 MCG tablet 1000 ugs by oral route.      finasteride (PROSCAR) 5 mg tablet Take 1 tablet by mouth once daily       metoprolol succinate (TOPROL-XL) 50 MG XL tablet TAKE 1/2 TABLET BY MOUTH EVERY DAY 90      pantoprazole (PROTONIX) 40 MG DR tablet 40 mg by oral route.      sertraline (ZOLOFT) 50 MG tablet Take 1 tablet by mouth once daily      zolpidem (AMBIEN) 10 mg tablet TAKE 1 TABLET BY MOUTH AT BEDTIME AS NEEDED ONCE DAILY       No current facility-administered medications on file prior to visit.     Family History Family History Problem Relation Age of Onset  Diabetes Mother        Social History   Tobacco Use Smoking Status Never Smokeless Tobacco Not on file     Social History Social History    Socioeconomic History  Marital status: Married Tobacco Use  Smoking status: Never Substance and Sexual Activity  Alcohol use: Never  Drug use: Never      Objective:     Vitals:   06/11/22 1016 BP: 126/62 Pulse: 79 Temp: 36.6 C (97.9 F) SpO2: 98% Weight: 74 kg (163 lb 3.2 oz) Height: 170.2 cm ('5\' 7"'$ )   Body mass index is 25.56 kg/m.   Physical Exam Vitals reviewed.  Constitutional:  General: He is not in acute distress.    Appearance: Normal appearance.  HENT:     Head: Normocephalic and atraumatic.  Eyes:     General: No scleral icterus.    Conjunctiva/sclera: Conjunctivae normal.  Cardiovascular:     Rate and Rhythm: Normal rate and regular rhythm.     Heart sounds: No murmur heard. Pulmonary:     Effort: Pulmonary effort is normal. No respiratory distress.     Breath sounds: Normal breath sounds.  Musculoskeletal:     Cervical back: Normal range of motion.  Skin:    General: Skin is warm and dry.     Coloration: Skin is not jaundiced.  Neurological:     General: No focal deficit present.     Mental Status: He is alert and oriented to person, place, and time.  Psychiatric:        Mood and Affect: Mood normal.        Behavior: Behavior normal.        Assessment and Plan: Diagnoses and all orders for this visit:   Proximal muscle weakness    Muscle weakness -     CCS Case Posting Request; Future       This is a 76 yo male presenting with progressive proximal muscle weakness for the last 2 years. He has been referred by his neurologist for a biopsy of the left vastus lateralis muscle for further workup. We will schedule him as soon as possible for an excisional biopsy. I reviewed the details of this procedure with the patient and he agrees to proceed. He has very recently seen cardiology and has no concerning cardiac symptoms. He should continue taking his aspirin throughout the perioperative period.  Michaelle Birks, [facility], Hepatobiliary and Pancreatic Surgery 06/11/22 10:48 AM

## 2022-06-11 NOTE — H&P (Signed)
History of Present Illness: Timothy Gates is a 76 y.o. male who was referred to me for evaluation for a muscle biopsy. He has had gradual onset of unsteady gait since 2021. He has difficulty standing up and has had some falls recently. He was referred to neurology and workup showed persistently elevated CPK levels. He recently had an MRI of the lumbar and cervical spine, which did not show an etiology of the symptoms. He was referred by his neurologist for a muscle biopsy of the left vastus lateralis.      He has a history of CAD and had a CABG in 2000. His last echo in Nov 2022 showed a normal LVEF with mild mitral regurgitation. He recently saw his cardiologist on 05/01/22 and will follow up again in 1 year. He was not having any chest pain at this visit and denies chest pain today.       Review of Systems: A complete review of systems was obtained from the patient.  I have reviewed this information and discussed as appropriate with the patient.  See HPI as well for other ROS.     Medical History: Past Medical HistoryExpand by Default Past Medical History: Diagnosis Date  Arthritis    Heart valve disease    Hyperlipidemia    Hypertension    Sleep apnea        Patient Active Problem List Diagnosis  Proximal muscle weakness     Past Surgical History Past Surgical History: Procedure Laterality Date  open heart surgery          Allergies Allergies Allergen Reactions  Diphenhydramine Hcl Other (See Comments)     Headache      Current Outpatient Medications on File Prior to Visit Medication Sig Dispense Refill  rosuvastatin (CRESTOR) 20 MG tablet Take 1 tablet by mouth once daily      aspirin 81 MG chewable tablet 81 mg by oral route.      cholecalciferol (VITAMIN D3) 1000 unit capsule 1000 units by oral route.      cyanocobalamin (VITAMIN B12) 1000 MCG tablet 1000 ugs by oral route.      finasteride (PROSCAR) 5 mg tablet Take 1 tablet by mouth once daily       metoprolol succinate (TOPROL-XL) 50 MG XL tablet TAKE 1/2 TABLET BY MOUTH EVERY DAY 90      pantoprazole (PROTONIX) 40 MG DR tablet 40 mg by oral route.      sertraline (ZOLOFT) 50 MG tablet Take 1 tablet by mouth once daily      zolpidem (AMBIEN) 10 mg tablet TAKE 1 TABLET BY MOUTH AT BEDTIME AS NEEDED ONCE DAILY       No current facility-administered medications on file prior to visit.     Family History Family History Problem Relation Age of Onset  Diabetes Mother        Social History   Tobacco Use Smoking Status Never Smokeless Tobacco Not on file     Social History Social History    Socioeconomic History  Marital status: Married Tobacco Use  Smoking status: Never Substance and Sexual Activity  Alcohol use: Never  Drug use: Never      Objective:     Vitals:   06/11/22 1016 BP: 126/62 Pulse: 79 Temp: 36.6 C (97.9 F) SpO2: 98% Weight: 74 kg (163 lb 3.2 oz) Height: 170.2 cm ('5\' 7"'$ )   Body mass index is 25.56 kg/m.   Physical Exam Vitals reviewed.  Constitutional:  General: He is not in acute distress.    Appearance: Normal appearance.  HENT:     Head: Normocephalic and atraumatic.  Eyes:     General: No scleral icterus.    Conjunctiva/sclera: Conjunctivae normal.  Cardiovascular:     Rate and Rhythm: Normal rate and regular rhythm.     Heart sounds: No murmur heard. Pulmonary:     Effort: Pulmonary effort is normal. No respiratory distress.     Breath sounds: Normal breath sounds.  Musculoskeletal:     Cervical back: Normal range of motion.  Skin:    General: Skin is warm and dry.     Coloration: Skin is not jaundiced.  Neurological:     General: No focal deficit present.     Mental Status: He is alert and oriented to person, place, and time.  Psychiatric:        Mood and Affect: Mood normal.        Behavior: Behavior normal.        Assessment and Plan: Diagnoses and all orders for this visit:   Proximal muscle weakness    Muscle weakness -     CCS Case Posting Request; Future       This is a 76 yo male presenting with progressive proximal muscle weakness for the last 2 years. He has been referred by his neurologist for a biopsy of the left vastus lateralis muscle for further workup. We will schedule him as soon as possible for an excisional biopsy. I reviewed the details of this procedure with the patient and he agrees to proceed. He has very recently seen cardiology and has no concerning cardiac symptoms. He should continue taking his aspirin throughout the perioperative period.  Michaelle Birks, [facility], Hepatobiliary and Pancreatic Surgery 06/11/22 10:48 AM

## 2022-06-12 NOTE — Patient Instructions (Addendum)
DUE TO COVID-19 ONLY TWO VISITORS  (aged 77 and older)  ARE ALLOWED TO COME WITH YOU AND STAY IN THE WAITING ROOM ONLY DURING PRE OP AND PROCEDURE.   **NO VISITORS ARE ALLOWED IN THE SHORT STAY AREA OR RECOVERY ROOM!!**   Your procedure is scheduled on: 06/21/22   Report to [facility] Main Entrance    Report to admitting at 6:00 AM   Call this number if you have problems the morning of surgery [phone]   Do not eat food :After Midnight.   After Midnight you may have the following liquids until 5:15 AM DAY OF SURGERY  Water Black Coffee (sugar ok, NO MILK/CREAM OR CREAMERS)  Tea (sugar ok, NO MILK/CREAM OR CREAMERS) regular and decaf                             Plain Jell-O (NO RED)                                           Fruit ices (not with fruit pulp, NO RED)                                     Popsicles (NO RED)                                                                  Juice: apple, WHITE grape, WHITE cranberry Sports drinks like Gatorade (NO RED)  FOLLOW BOWEL PREP AND ANY ADDITIONAL PRE OP INSTRUCTIONS YOU RECEIVED FROM YOUR SURGEON'S OFFICE!!!     Oral Hygiene is also important to reduce your risk of infection.                                    Remember - BRUSH YOUR TEETH THE MORNING OF SURGERY WITH YOUR REGULAR TOOTHPASTE   Take these medicines the morning of surgery with A SIP OF WATER: Tylenol, Finasteride, Meclizine, Metoprolol, Pantoprazole, Rosuvastatin, Sertraline                              You may not have any metal on your body including jewelry, and body piercing             Do not wear lotions, powders, cologne, or deodorant              Men may shave face and neck.   Do not bring valuables to the hospital. [place].   Contacts, dentures or bridgework may not be worn into surgery.  DO NOT [place]. PHARMACY WILL DISPENSE MEDICATIONS LISTED ON YOUR  MEDICATION LIST TO YOU DURING YOUR ADMISSION [place]!    Patients discharged on the day of surgery will not be allowed to drive home.  Someone NEEDS to  stay with you for the first 24 hours after anesthesia.              Please read over the following fact sheets you were given: IF YOU HAVE QUESTIONS ABOUT YOUR PRE-OP INSTRUCTIONS PLEASE CALL [facility] - Preparing for Surgery Before surgery, you can play an important role.  Because skin is not sterile, your skin needs to be as free of germs as possible.  You can reduce the number of germs on your skin by washing with CHG (chlorahexidine gluconate) soap before surgery.  CHG is an antiseptic cleaner which kills germs and bonds with the skin to continue killing germs even after washing. Please DO NOT use if you have an allergy to CHG or antibacterial soaps.  If your skin becomes reddened/irritated stop using the CHG and inform your nurse when you arrive at Short Stay. Do not shave (including legs and underarms) for at least 48 hours prior to the first CHG shower.  You may shave your face/neck.  Please follow these instructions carefully:  1.  Shower with CHG Soap the night before surgery and the  morning of surgery.  2.  If you choose to wash your hair, wash your hair first as usual with your normal  shampoo.  3.  After you shampoo, rinse your hair and body thoroughly to remove the shampoo.                             4.  Use CHG as you would any other liquid soap.  You can apply chg directly to the skin and wash.  Gently with a scrungie or clean washcloth.  5.  Apply the CHG Soap to your body ONLY FROM THE NECK DOWN.   Do   not use on face/ open                           Wound or open sores. Avoid contact with eyes, ears mouth and   genitals (private parts).                       Wash face,  Genitals (private parts) with your normal soap.             6.  Wash thoroughly, paying special attention to the area where  your    surgery  will be performed.  7.  Thoroughly rinse your body with warm water from the neck down.  8.  DO NOT shower/wash with your normal soap after using and rinsing off the CHG Soap.                9.  Pat yourself dry with a clean towel.            10.  Wear clean pajamas.            11.  Place clean sheets on your bed the night of your first shower and do not  sleep with pets. Day of Surgery : Do not apply any lotions/deodorants the morning of surgery.  Please wear clean clothes to the hospital/surgery center.  FAILURE TO FOLLOW THESE INSTRUCTIONS MAY RESULT IN THE CANCELLATION OF YOUR SURGERY  PATIENT SIGNATURE_________________________________  NURSE SIGNATURE__________________________________  ________________________________________________________________________

## 2022-06-12 NOTE — Progress Notes (Addendum)
COVID Vaccine Completed: yes   Date of COVID positive in last 90 days: no  PCP - Deland Pretty, MD Cardiologist - Sanda Klein, MD  Chest x-ray - n/a EKG - 05/01/22 Epic Stress Test - 08/09/16 Epic ECHO - 10/02/21 Epic Cardiac Cath - 2000 Pacemaker/ICD device last checked: n/a Spinal Cord Stimulator: n/a  Bowel Prep - no  Sleep Study - yes, positive CPAP - not for 7 years per pt  Fasting Blood Sugar - n/a Checks Blood Sugar _____ times a day  Blood Thinner Instructions: Aspirin Instructions: ASA 81, no instructions per pt, will contact PCP Last Dose:  Activity level: Can perform activities of daily living without stopping and without symptoms of chest pain or shortness of breath. Difficulty with stairs due to leg weakness.    Anesthesia review: SOB, CAD, OSA, HTN, CABG 2000  Patient denies shortness of breath, fever, cough and chest pain at PAT appointment  Patient verbalized understanding of instructions that were given to them at the PAT appointment. Patient was also instructed that they will need to review over the PAT instructions again at home before surgery.

## 2022-06-13 ENCOUNTER — Encounter (HOSPITAL_COMMUNITY): Payer: Self-pay

## 2022-06-13 ENCOUNTER — Encounter (HOSPITAL_COMMUNITY)
Admission: RE | Admit: 2022-06-13 | Discharge: 2022-06-13 | Disposition: A | Discharge status: Home / Self Care | Payer: Medicare Other | Source: Ambulatory Visit | Attending: Surgery | Admitting: Surgery

## 2022-06-13 VITALS — BP 157/67 | HR 65 | Temp 98.4°F | Resp 12 | Ht 66.0 in | Wt 163.0 lb

## 2022-06-13 DIAGNOSIS — M6281 Muscle weakness (generalized): Secondary | ICD-10-CM | POA: Insufficient documentation

## 2022-06-13 DIAGNOSIS — G4733 Obstructive sleep apnea (adult) (pediatric): Secondary | ICD-10-CM | POA: Diagnosis not present

## 2022-06-13 DIAGNOSIS — Z01812 Encounter for preprocedural laboratory examination: Secondary | ICD-10-CM | POA: Insufficient documentation

## 2022-06-13 DIAGNOSIS — Z951 Presence of aortocoronary bypass graft: Secondary | ICD-10-CM | POA: Diagnosis not present

## 2022-06-13 DIAGNOSIS — Z87891 Personal history of nicotine dependence: Secondary | ICD-10-CM | POA: Diagnosis not present

## 2022-06-13 DIAGNOSIS — I2510 Atherosclerotic heart disease of native coronary artery without angina pectoris: Secondary | ICD-10-CM | POA: Insufficient documentation

## 2022-06-13 DIAGNOSIS — I10 Essential (primary) hypertension: Secondary | ICD-10-CM | POA: Insufficient documentation

## 2022-06-13 HISTORY — DX: Pneumonia, unspecified organism: J18.9

## 2022-06-13 HISTORY — DX: Essential (primary) hypertension: I10

## 2022-06-13 HISTORY — DX: Malignant (primary) neoplasm, unspecified: C80.1

## 2022-06-13 HISTORY — DX: Depression, unspecified: F32.A

## 2022-06-13 HISTORY — DX: Anxiety disorder, unspecified: F41.9

## 2022-06-13 LAB — CBC
HCT: 35.4 % — ABNORMAL LOW (ref 39.0–52.0)
Hemoglobin: 11.2 g/dL — ABNORMAL LOW (ref 13.0–17.0)
MCH: 26.9 pg (ref 26.0–34.0)
MCHC: 31.6 g/dL (ref 30.0–36.0)
MCV: 85.1 fL (ref 80.0–100.0)
Platelets: 135 10*3/uL — ABNORMAL LOW (ref 150–400)
RBC: 4.16 MIL/uL — ABNORMAL LOW (ref 4.22–5.81)
RDW: 14.2 % (ref 11.5–15.5)
WBC: 5.1 10*3/uL (ref 4.0–10.5)
nRBC: 0 % (ref 0.0–0.2)

## 2022-06-13 LAB — BASIC METABOLIC PANEL
Anion gap: 7 (ref 5–15)
BUN: 17 mg/dL (ref 8–23)
CO2: 24 mmol/L (ref 22–32)
Calcium: 9 mg/dL (ref 8.9–10.3)
Chloride: 109 mmol/L (ref 98–111)
Creatinine, Ser: 0.71 mg/dL (ref 0.61–1.24)
GFR, Estimated: >60 mL/min (ref >60)
Glucose, Bld: 135 mg/dL — ABNORMAL HIGH (ref 70–99)
Potassium: 4.2 mmol/L (ref 3.5–5.1)
Sodium: 140 mmol/L (ref 135–145)

## 2022-06-14 NOTE — Telephone Encounter (Signed)
I reviewed [place] surgical consultation by Dr. Zenia Resides on June 11, 2022, muscle biopsy for left vastus lateralis is scheduled, she should continue taking his aspirin throughout the perioperative.Marland Kitchen

## 2022-06-14 NOTE — Progress Notes (Signed)
Anesthesia Chart Review   Case: [id number] Date/Time: 06/21/22 0745   Procedure: LEFT THIGH MUSCLE BIOPSY (Left)   Anesthesia type: Choice   Pre-op diagnosis: PROXIMAL MUSCLE WEAKNESS   Location: [facility] ROOM 04 / [facility]   Surgeons: Dwan Bolt, MD       DISCUSSION:75 y.o. former smoker with h/o OSA on CPAP, CAD (CABG 2000), HTN, proximal muscle weakness scheduled for above procedure 06/21/2022 with Dr. Michaelle Birks.   Pt last seen by cardiology 05/01/2022. Stable at this visit with 1 year follow up recommended.   Anticipate pt can proceed with planned procedure barring acute status change.   VS: BP (!) 157/67   Pulse 65   Temp 36.9 C (Oral)   Resp 12   Ht '5\' 6"'$  (1.676 m)   Wt 73.9 kg   SpO2 98%   BMI 26.31 kg/m   PROVIDERS: Deland Pretty, MD is PCP   Cardiologist - Sanda Klein, MD LABS: Labs reviewed: Acceptable for surgery. (all labs ordered are listed, but only abnormal results are displayed)  Labs Reviewed  BASIC METABOLIC PANEL - Abnormal; Notable for the following components:      Result Value   Glucose, Bld 135 (*)    All other components within normal limits  CBC - Abnormal; Notable for the following components:   RBC 4.16 (*)    Hemoglobin 11.2 (*)    HCT 35.4 (*)    Platelets 135 (*)    All other components within normal limits     IMAGES:   EKG: 05/01/2022 Rate 65 bpm  NSR Possible left atrial enlargement RBBB Left ventricular hypertrophy with QRS widening Possible lateral infarct, age undetermined   CV: Echo 10/01/2021 1. Left ventricular ejection fraction, by estimation, is 60 to 65%. The  left ventricle has normal function. The left ventricle has no regional  wall motion abnormalities. There is mild concentric left ventricular  hypertrophy. Left ventricular diastolic  parameters are indeterminate.   2. Right ventricular systolic function is normal. The right ventricular  size is normal. Tricuspid regurgitation signal is inadequate for  assessing  PA pressure.   3. The mitral valve is abnormal. Mild mitral valve regurgitation. No  evidence of mitral stenosis. Moderate mitral annular calcification.   4. The aortic valve is tricuspid. Aortic valve regurgitation is not  visualized. No aortic stenosis is present.  Past Medical History:  Diagnosis Date   Anxiety    Arthritis    lower back   Balance problem    CAD (coronary artery disease)    Cancer (HCC)    melanoma   Depression    GERD (gastroesophageal reflux disease)    Hyperlipidemia    Hypertension    Iron deficiency anemia    Low back pain    OSA (obstructive sleep apnea)    Pneumonia    RBBB (right bundle branch block) 10/28/2019   Noted on EKG   Tinnitus    Left ear    Past Surgical History:  Procedure Laterality Date   CORONARY ARTERY BYPASS GRAFT  04/02/1999   double   DG BARIUM SWALLOW ([place] HX)     FISSURECTOMY  2006   SHOULDER ARTHROSCOPY WITH ROTATOR CUFF REPAIR AND SUBACROMIAL DECOMPRESSION Left 03/22/2020   Procedure: RIGHT SHOULDER MINI OPEN ROTATOR CUFF REPAIR AND SUBACROMIAL DECOMPRESSION;  Surgeon: Susa Day, MD;  Location: [facility];  Service: Orthopedics;  Laterality: Left;  90 mins   SIGMOIDOSCOPY     SKIN CANCER EXCISION  UPPER GI ENDOSCOPY      MEDICATIONS:  acetaminophen (TYLENOL) 325 MG tablet   Ascorbic Acid (VITAMIN C PO)   aspirin 81 MG chewable tablet   Cholecalciferol (VITAMIN D3) 1000 units CAPS   finasteride (PROSCAR) 5 MG tablet   MAGNESIUM PO   meclizine (ANTIVERT) 25 MG tablet   metoprolol succinate (TOPROL-XL) 50 MG 24 hr tablet   pantoprazole (PROTONIX) 40 MG tablet   rosuvastatin (CRESTOR) 20 MG tablet   sertraline (ZOLOFT) 50 MG tablet   vitamin B-12 (CYANOCOBALAMIN) 1000 MCG tablet   zolpidem (AMBIEN) 10 MG tablet   No current facility-administered medications for this encounter.   Konrad Felix Ward, PA-C [facility] [phone]

## 2022-06-18 ENCOUNTER — Telehealth: Payer: Self-pay | Admitting: Cardiovascular Disease

## 2022-06-18 NOTE — Telephone Encounter (Signed)
I contacted patient, advised that we should have clearance to [place] to have it in the system.   However patient is scheduled on 07/28 (Friday)- I did advise them to send it as URGENT, but will send it over to Dr.C to make him aware as well.   Thanks!

## 2022-06-18 NOTE — Telephone Encounter (Signed)
Stop aspirin starting today, please

## 2022-06-18 NOTE — Telephone Encounter (Signed)
Pt states that he was told by his surgeon's office for the left thigh biopsy, Dr. Michaelle Birks, that he should hold his aspirin, but he wanted to just make sure with his cardiologist before he does this. Please advise.

## 2022-06-18 NOTE — Telephone Encounter (Signed)
Called patient, advised of MD response.  Patient aware to do this.  Thankful for call back.

## 2022-06-20 NOTE — Anesthesia Preprocedure Evaluation (Addendum)
Anesthesia Evaluation  Patient identified by MRN, date of birth, ID band Patient awake    Reviewed: Allergy & Precautions, NPO status , Patient's Chart, lab work & pertinent test results  Airway Mallampati: II  TM Distance: >3 FB     Dental   Pulmonary sleep apnea , pneumonia, former smoker,    breath sounds clear to auscultation       Cardiovascular hypertension, + CAD  + dysrhythmias  Rhythm:Regular Rate:Normal     Neuro/Psych PSYCHIATRIC DISORDERS    GI/Hepatic Neg liver ROS, GERD  ,  Endo/Other  negative endocrine ROS  Renal/GU negative Renal ROS     Musculoskeletal  (+) Arthritis ,   Abdominal   Peds  Hematology  (+) Blood dyscrasia, ,   Anesthesia Other Findings   Reproductive/Obstetrics                            Anesthesia Physical Anesthesia Plan  ASA: 3  Anesthesia Plan: General   Post-op Pain Management:    Induction: Intravenous  PONV Risk Score and Plan: 2 and Ondansetron, Dexamethasone and Midazolam  Airway Management Planned: LMA  Additional Equipment:   Intra-op Plan:   Post-operative Plan: Extubation in OR  Informed Consent: I have reviewed the patients History and Physical, chart, labs and discussed the procedure including the risks, benefits and alternatives for the proposed anesthesia with the patient or authorized representative who has indicated his/her understanding and acceptance.     Dental advisory given  Plan Discussed with: CRNA and Anesthesiologist  Anesthesia Plan Comments:         Anesthesia Quick Evaluation

## 2022-06-21 ENCOUNTER — Other Ambulatory Visit: Payer: Self-pay

## 2022-06-21 ENCOUNTER — Ambulatory Visit (HOSPITAL_COMMUNITY)
Admission: RE | Admit: 2022-06-21 | Discharge: 2022-06-21 | Disposition: A | Discharge status: Home / Self Care | Payer: Medicare Other | Attending: Surgery | Admitting: Surgery

## 2022-06-21 ENCOUNTER — Encounter (HOSPITAL_COMMUNITY)
Admission: RE | Disposition: A | Discharge status: Home / Self Care | Payer: Self-pay | Source: Home / Self Care | Attending: Surgery

## 2022-06-21 ENCOUNTER — Encounter (HOSPITAL_COMMUNITY): Payer: Self-pay | Admitting: Surgery

## 2022-06-21 ENCOUNTER — Ambulatory Visit (HOSPITAL_BASED_OUTPATIENT_CLINIC_OR_DEPARTMENT_OTHER): Payer: Medicare Other | Admitting: Anesthesiology

## 2022-06-21 ENCOUNTER — Ambulatory Visit (HOSPITAL_COMMUNITY): Payer: Medicare Other | Admitting: Physician Assistant

## 2022-06-21 DIAGNOSIS — I10 Essential (primary) hypertension: Secondary | ICD-10-CM | POA: Insufficient documentation

## 2022-06-21 DIAGNOSIS — G4730 Sleep apnea, unspecified: Secondary | ICD-10-CM | POA: Diagnosis not present

## 2022-06-21 DIAGNOSIS — Z87891 Personal history of nicotine dependence: Secondary | ICD-10-CM | POA: Insufficient documentation

## 2022-06-21 DIAGNOSIS — K219 Gastro-esophageal reflux disease without esophagitis: Secondary | ICD-10-CM | POA: Insufficient documentation

## 2022-06-21 DIAGNOSIS — M1990 Unspecified osteoarthritis, unspecified site: Secondary | ICD-10-CM | POA: Diagnosis not present

## 2022-06-21 DIAGNOSIS — M609 Myositis, unspecified: Secondary | ICD-10-CM | POA: Insufficient documentation

## 2022-06-21 DIAGNOSIS — I2510 Atherosclerotic heart disease of native coronary artery without angina pectoris: Secondary | ICD-10-CM

## 2022-06-21 DIAGNOSIS — M6281 Muscle weakness (generalized): Secondary | ICD-10-CM

## 2022-06-21 HISTORY — PX: MUSCLE BIOPSY: SHX716

## 2022-06-21 SURGERY — MUSCLE BIOPSY
Anesthesia: General | Laterality: Left

## 2022-06-21 MED ORDER — DEXAMETHASONE SODIUM PHOSPHATE 10 MG/ML IJ SOLN
INTRAMUSCULAR | Status: AC
  Filled 2022-06-21: qty 1

## 2022-06-21 MED ORDER — METOPROLOL SUCCINATE ER 25 MG PO TB24
ORAL_TABLET | ORAL | Status: AC
  Filled 2022-06-21: qty 1

## 2022-06-21 MED ORDER — LIDOCAINE HCL (PF) 2 % IJ SOLN
INTRAMUSCULAR | Status: AC
  Filled 2022-06-21: qty 5

## 2022-06-21 MED ORDER — METOPROLOL SUCCINATE ER 25 MG PO TB24: 50.0000 mg | ORAL_TABLET | Freq: Once | ORAL | Status: DC

## 2022-06-21 MED ORDER — BUPIVACAINE-EPINEPHRINE 0.25% -1:200000 IJ SOLN
INTRAMUSCULAR | Status: DC | PRN
  Administered 2022-06-21: 10 mL

## 2022-06-21 MED ORDER — FENTANYL CITRATE PF 50 MCG/ML IJ SOSY: 25.0000 ug | PREFILLED_SYRINGE | INTRAMUSCULAR | Status: DC | PRN

## 2022-06-21 MED ORDER — FENTANYL CITRATE (PF) 100 MCG/2ML IJ SOLN
INTRAMUSCULAR | Status: AC
  Filled 2022-06-21: qty 2

## 2022-06-21 MED ORDER — CEFAZOLIN SODIUM-DEXTROSE 2-4 GM/100ML-% IV SOLN
2.0000 g | INTRAVENOUS | Status: AC
  Administered 2022-06-21: 2 g via INTRAVENOUS
  Filled 2022-06-21: qty 100

## 2022-06-21 MED ORDER — PROPOFOL 10 MG/ML IV BOLUS
INTRAVENOUS | Status: AC
  Filled 2022-06-21: qty 20

## 2022-06-21 MED ORDER — PHENYLEPHRINE HCL-NACL 20-0.9 MG/250ML-% IV SOLN
INTRAVENOUS | Status: DC | PRN
  Administered 2022-06-21: 35 ug/min via INTRAVENOUS

## 2022-06-21 MED ORDER — ONDANSETRON HCL 4 MG/2ML IJ SOLN
INTRAMUSCULAR | Status: AC
  Filled 2022-06-21: qty 2

## 2022-06-21 MED ORDER — BUPIVACAINE-EPINEPHRINE (PF) 0.25% -1:200000 IJ SOLN
INTRAMUSCULAR | Status: AC
  Filled 2022-06-21: qty 30

## 2022-06-21 MED ORDER — EPHEDRINE 5 MG/ML INJ
INTRAVENOUS | Status: AC
Start: 2022-06-21 — End: ?
  Filled 2022-06-21: qty 5

## 2022-06-21 MED ORDER — ORAL CARE MOUTH RINSE: 15.0000 mL | Freq: Once | OROMUCOSAL | Status: AC

## 2022-06-21 MED ORDER — ONDANSETRON HCL 4 MG/2ML IJ SOLN
INTRAMUSCULAR | Status: DC | PRN
  Administered 2022-06-21: 4 mg via INTRAVENOUS

## 2022-06-21 MED ORDER — FENTANYL CITRATE (PF) 100 MCG/2ML IJ SOLN
INTRAMUSCULAR | Status: DC | PRN
  Administered 2022-06-21 (×2): 25 ug via INTRAVENOUS

## 2022-06-21 MED ORDER — LIDOCAINE 2% (20 MG/ML) 5 ML SYRINGE
INTRAMUSCULAR | Status: DC | PRN
  Administered 2022-06-21: 60 mg via INTRAVENOUS

## 2022-06-21 MED ORDER — PROPOFOL 10 MG/ML IV BOLUS
INTRAVENOUS | Status: DC | PRN
  Administered 2022-06-21: 120 mg via INTRAVENOUS

## 2022-06-21 MED ORDER — METOPROLOL SUCCINATE ER 25 MG PO TB24
25.0000 mg | ORAL_TABLET | Freq: Once | ORAL | Status: AC
  Administered 2022-06-21: 25 mg via ORAL

## 2022-06-21 MED ORDER — CHLORHEXIDINE GLUCONATE 0.12 % MT SOLN
15.0000 mL | Freq: Once | OROMUCOSAL | Status: AC
  Administered 2022-06-21: 15 mL via OROMUCOSAL

## 2022-06-21 MED ORDER — 0.9 % SODIUM CHLORIDE (POUR BTL) OPTIME
TOPICAL | Status: DC | PRN
  Administered 2022-06-21: 1000 mL

## 2022-06-21 MED ORDER — LACTATED RINGERS IV SOLN: INTRAVENOUS | Status: DC

## 2022-06-21 SURGICAL SUPPLY — 34 items
ADH SKN CLS APL DERMABOND .7 (GAUZE/BANDAGES/DRESSINGS) ×1
APL PRP STRL LF DISP 70% ISPRP (MISCELLANEOUS) ×1
BAG COUNTER SPONGE SURGICOUNT (BAG) ×2 IMPLANT
BAG SPNG CNTER NS LX DISP (BAG) ×1
CHLORAPREP W/TINT 26 (MISCELLANEOUS) ×2 IMPLANT
CNTNR URN SCR LID CUP LEK RST (MISCELLANEOUS) ×1 IMPLANT
CONT SPEC 4OZ STRL OR WHT (MISCELLANEOUS) ×2
COVER SURGICAL LIGHT HANDLE (MISCELLANEOUS) ×2 IMPLANT
DERMABOND ADVANCED (GAUZE/BANDAGES/DRESSINGS) ×1
DERMABOND ADVANCED .7 DNX12 (GAUZE/BANDAGES/DRESSINGS) ×1 IMPLANT
DRAPE LAPAROTOMY T 98X78 PEDS (DRAPES) ×2 IMPLANT
DRSG TELFA 3X8 NADH (GAUZE/BANDAGES/DRESSINGS) ×2 IMPLANT
ELECT REM PT RETURN 15FT ADLT (MISCELLANEOUS) ×2 IMPLANT
GAUZE 4X4 16PLY ~~LOC~~+RFID DBL (SPONGE) ×2 IMPLANT
GLOVE BIO SURGEON STRL SZ 6 (GLOVE) ×2 IMPLANT
GLOVE BIOGEL PI MICRO 5.5 (GLOVE) ×1
GLOVE BIOGEL PI MICRO STRL 5.5 (GLOVE) ×1 IMPLANT
GLOVE INDICATOR 6.5 STRL GRN (GLOVE) ×2 IMPLANT
GOWN STRL REUS W/ TWL LRG LVL3 (GOWN DISPOSABLE) ×2 IMPLANT
GOWN STRL REUS W/TWL LRG LVL3 (GOWN DISPOSABLE) ×4
KIT BASIN OR (CUSTOM PROCEDURE TRAY) ×2 IMPLANT
KIT TURNOVER KIT A (KITS) ×1 IMPLANT
NDL HYPO 25X1 1.5 SAFETY (NEEDLE) ×1 IMPLANT
NEEDLE HYPO 25X1 1.5 SAFETY (NEEDLE) ×2 IMPLANT
NS IRRIG 1000ML POUR BTL (IV SOLUTION) ×2 IMPLANT
PACK GENERAL/GYN (CUSTOM PROCEDURE TRAY) ×2 IMPLANT
PAD ARMBOARD 7.5X6 YLW CONV (MISCELLANEOUS) ×2 IMPLANT
PAD DRESSING TELFA 3X8 NADH (GAUZE/BANDAGES/DRESSINGS) ×1 IMPLANT
SUT MNCRL AB 4-0 PS2 18 (SUTURE) ×2 IMPLANT
SUT VIC AB 2-0 SH 27 (SUTURE) ×2
SUT VIC AB 2-0 SH 27XBRD (SUTURE) ×1 IMPLANT
SYR CONTROL 10ML LL (SYRINGE) ×2 IMPLANT
TOWEL OR 17X26 10 PK STRL BLUE (TOWEL DISPOSABLE) ×2 IMPLANT
TOWEL OR NON WOVEN STRL DISP B (DISPOSABLE) ×2 IMPLANT

## 2022-06-21 NOTE — Discharge Instructions (Signed)
[facility] DISCHARGE INSTRUCTIONS  Activity Ok to shower in 24 hours, but do not bathe or submerge incisions underwater.  Wound Care Your incision is covered with skin glue called Dermabond. This will peel off on its own over time. You may shower and allow warm soapy water to run over your incision. Gently pat dry. Do not submerge your incision underwater for 2 weeks. Monitor your incision for any new redness, tenderness, or drainage.  When to Call us: Fever greater than 100.5 New redness, drainage, or swelling at incision site Severe pain, nausea, or vomiting  For questions or concerns, please call the office at [phone].

## 2022-06-21 NOTE — Interval H&P Note (Signed)
History and Physical Interval Note:  06/21/2022 7:25 AM  Timothy Gates  has presented today for surgery, with the diagnosis of [place].  The various methods of treatment have been discussed with the patient and family. After consideration of risks, benefits and other options for treatment, the patient has consented to  Procedure(s): LEFT THIGH MUSCLE BIOPSY (Left) as a surgical intervention.  The patient's history has been reviewed, patient examined, no change in status, stable for surgery.  I have reviewed the patient's chart and labs.  Questions were answered to the patient's satisfaction.     Dwan Bolt

## 2022-06-21 NOTE — Anesthesia Procedure Notes (Signed)
Procedure Name: LMA Insertion Date/Time: 06/21/2022 7:33 AM  Performed by: Maxwell Caul, CRNAPre-anesthesia Checklist: Patient identified, Emergency Drugs available, Suction available and Patient being monitored Patient Re-evaluated:Patient Re-evaluated prior to induction Oxygen Delivery Method: Circle system utilized Preoxygenation: Pre-oxygenation with 100% oxygen Induction Type: IV induction LMA: LMA with gastric port inserted LMA Size: 4.0 Number of attempts: 1 Placement Confirmation: positive ETCO2 and breath sounds checked- equal and bilateral Tube secured with: Tape Dental Injury: Teeth and Oropharynx as per pre-operative assessment

## 2022-06-21 NOTE — Anesthesia Postprocedure Evaluation (Signed)
Anesthesia Post Note  Patient: Timothy Gates  Procedure(s) Performed: LEFT THIGH MUSCLE BIOPSY (Left)     Patient location during evaluation: PACU Anesthesia Type: General Level of consciousness: awake Pain management: pain level controlled Vital Signs Assessment: post-procedure vital signs reviewed and stable Respiratory status: spontaneous breathing Cardiovascular status: stable Postop Assessment: no apparent nausea or vomiting Anesthetic complications: no   No notable events documented.  Last Vitals:  Vitals:   06/21/22 0830 06/21/22 0845  BP: (!) 153/62 (!) 147/66  Pulse: 62 60  Resp: 19 12  Temp:    SpO2: 95% 97%    Last Pain:  Vitals:   06/21/22 0830  TempSrc:   PainSc: 0-No pain                 Ardra Kuznicki

## 2022-06-21 NOTE — Transfer of Care (Signed)
Immediate Anesthesia Transfer of Care Note  Patient: Timothy Gates  Procedure(s) Performed: LEFT THIGH MUSCLE BIOPSY (Left)  Patient Location: PACU  Anesthesia Type:General  Level of Consciousness: awake, alert  and oriented  Airway & Oxygen Therapy: Patient Spontanous Breathing and Patient connected to face mask oxygen  Post-op Assessment: Report given to RN and Post -op Vital signs reviewed and stable  Post vital signs: Reviewed and stable  Last Vitals:  Vitals Value Taken Time  BP 161/68 06/21/22 0810  Temp    Pulse 63 06/21/22 0812  Resp 10 06/21/22 0812  SpO2 100 % 06/21/22 0812  Vitals shown include unvalidated device data.  Last Pain:  Vitals:   06/21/22 0618  TempSrc:   PainSc: 6       Patients Stated Pain Goal: 7 (51/83/43 7357)  Complications: No notable events documented.

## 2022-06-21 NOTE — Op Note (Signed)
Date: 06/21/22  Patient: Timothy Gates MRN: [id number]  Preoperative Diagnosis: Proximal muscle weakness Postoperative Diagnosis: Same   Procedure: Left thigh muscle biopsy  Surgeon: Michaelle Birks, MD Assistant: Clerance Lav, MD (Resident)  EBL: Minimal  Anesthesia: General LMA  Specimens: Left thigh muscle  Indications: Timothy Gates is a 76 yo male who has had progressive onset of unsteady gait with proximal muscle weakness over the last 2 years. He was referred by his neurologist for a muscle biopsy of the left thigh.  Findings: Incisional biopsy of the left vastus lateralis muscle performed.  Procedure details: Informed consent was obtained in the preoperative area prior to the procedure. The patient was brought to the operating room and placed on the table in the supine position. General anesthesia was induced and appropriate lines and drains were placed for intraoperative monitoring. Perioperative antibiotics were administered per SCIP guidelines. The left thigh was prepped and draped in the usual sterile fashion. A pre-procedure timeout was taken verifying patient identity, surgical site and procedure to be performed.  A small longitudinal skin incision was made on the left lateral thigh, and the subcutaneous tissue was divided to expose the fascia of the vastus lateralis. The fascia was opened with cautery. A sample of the underlying muscle was grasped and excised with cautery. The muscle tissue was sent fresh to pathology. The wound was irrigated and appeared hemostatic. The fascia was closed with a running 2-0 Vicryl suture, and the skin was closed with a running subcuticular 4-0 monocryl suture. Dermabond was applied.  The patient tolerated the procedure well with no apparent complications. All counts were correct x2 at the end of the procedure. The patient was extubated and taken to PACU in stable condition.  Michaelle Birks, MD 06/21/22 9:17 AM

## 2022-06-22 ENCOUNTER — Encounter (HOSPITAL_COMMUNITY): Payer: Self-pay | Admitting: Surgery

## 2022-07-01 ENCOUNTER — Encounter (HOSPITAL_COMMUNITY): Payer: Self-pay

## 2022-07-01 ENCOUNTER — Telehealth: Payer: Self-pay

## 2022-07-01 NOTE — Telephone Encounter (Signed)
Received muscle biopsy results from 06/21/2022. I have placed in Dr. Rhea Belton office for review once she returns to the office.

## 2022-07-08 ENCOUNTER — Telehealth: Payer: Self-pay | Admitting: Neurology

## 2022-07-08 NOTE — Telephone Encounter (Addendum)
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  He may bring his family members with him

## 2022-07-09 NOTE — Telephone Encounter (Signed)
I called the pt and we discussed results. He verbalized understanding and appreciation for the call. Appt has been made for 07/15/2022 at 330. He understands he can bring family members to the appt with him.

## 2022-07-10 LAB — SURGICAL PATHOLOGY

## 2022-07-15 ENCOUNTER — Encounter (HOSPITAL_COMMUNITY): Payer: Self-pay

## 2022-07-15 ENCOUNTER — Encounter: Payer: Self-pay | Admitting: Neurology

## 2022-07-15 ENCOUNTER — Ambulatory Visit (INDEPENDENT_AMBULATORY_CARE_PROVIDER_SITE_OTHER): Payer: Medicare Other | Admitting: Neurology

## 2022-07-15 VITALS — BP 142/64 | HR 59 | Ht 66.0 in | Wt 164.5 lb

## 2022-07-15 DIAGNOSIS — M6281 Muscle weakness (generalized): Secondary | ICD-10-CM | POA: Diagnosis not present

## 2022-07-15 DIAGNOSIS — G7249 Other inflammatory and immune myopathies, not elsewhere classified: Secondary | ICD-10-CM | POA: Diagnosis not present

## 2022-07-15 DIAGNOSIS — M25551 Pain in right hip: Secondary | ICD-10-CM | POA: Insufficient documentation

## 2022-07-15 NOTE — Progress Notes (Signed)
ASSESSMENT AND PLAN  Timothy Gates is a 76 y.o. male   Gradual onset gait abnormality, Elevated CPK  MRI of cervical and lumbar spine showed no pathology to explain his current proximal lower and upper extremity weakness, gait abnormality,  Repeat CPK remains elevated, July 2023 was 1045.  EMG nerve conduction study in May 2023 showed polyphasic motor unit potential but with mildly decreased recruitment patterns, in combination with abnormal muscle biopsy with evidence of inflammatory myopathic changes, oftentimes above abnormal EMG findings seen in chronic remodeling process, can be related to the chronic myopathic disease  Muscle biopsy of left vastus lateralis in July 2023 showed marked abnormality, inflammatory myopathy,there is some features also suggestive inclusive body myositis which is the most common myopathy at this age, particularly  the Cox negative fibers and the apparent ragged red fibers, but no apparent rimmed vacuoles are seen  I had extensive discussion with patient, discussed with him possibility of immunosuppressive treatment, including prednisone, methotrexate, even IVIG, refused all other treatment options, worried about the side effect  Decided only return to clinic as needed    DIAGNOSTIC DATA (LABS, IMAGING, TESTING) - I reviewed patient records, labs, notes, testing and imaging myself where available.   MEDICAL HISTORY:  Timothy Gates, is a 76 year old male, seen in referral by my colleague Dr. Andrey Spearman for evaluation of gait abnormality, EMG nerve conduction study, elevated CPK, his primary care physician is Dr.Pharr, Thayer Jew, MD  I reviewed and summarized the referring note.PMHX HLD Anxiety CAD, s/p CABG 2000, HTN Chronic insomnia Left rotator cuff surgery.  He noticed gradual onset of unsteady gait since 2021, feeling imbalance, only mild numbness at the bottom of his feet, he denies finger numbness, right leg seems to give him more  trouble,  He was initially found to have mild elevated CPK in 2022, 731, repeat testing in May was 1209,  Extensive laboratory evaluation otherwise normal, including normal negative ANA, Jo 1 antibody, C-reactive protein, ESR, acetylcholine receptor antibody, ANA, TSH, B12, A1c was 6.3, normal CMP, creatinine 11.7  He has occasional low back pain radiating pain to right lower extremity, denies significant neck pain, denies bowel or bladder incontinence  He denies double vision, droopy eyelid, swallowing difficulties  Known history of cervical degenerative changes, MRI of cervical spine in 2008 showed multilevel degenerative changes, most noticeable at lower cervical region, moderate right foraminal narrowing C6-7, C5-6, no evidence of cord compression  MRI of lumbar spine from orthopedic clinic reported multilevel foraminal stenosis  UPDATE May 16 2022: Continue complains significant gait abnormality, has to get up from seated position, also noticed mild bilateral proximal upper extremity fairly symmetric weakness, no distal weakness,  We personally reviewed MRI of the cervical spine, on May 09, 2022, lower cervical degenerative changes no significant canal narrowing, no evidence of cord compression, 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  MRI of the lumbar spine, mild degenerative changes, no evidence of canal and foraminal narrowing  Extensive laboratory evaluation showed continued elevation of CPK 1035, LDH 303,, negative MuSK antibody, acetylcholine receptor antibody, thyroid functional test, CBC CMP, TSH, B12, ANA, anti-Jo1 antibody  I reviewed extensive medication list, he has been on different statin treatment since 2016, Pravachol, 40 mg, Crestor 20 mg since January 2023 He denies significant muscle achy pain  Repeat the needle examination of the right deltoid, vastus lateralis, right tibialis anterior, continue with evidence of normal morphology motor unit potential, with  complex polyspike motor unit potential, with actually slight decrease  including in patterns, I was not able to appreciate early recruitment, there was no typical small polyphasic myopathic motor unit potentials noted  Update July 15, 2022: He underwent left vastus lateralis muscle biopsy on June 21, 2022, which showed marked abnormality, consistent with inflammatory myopathy, there is some features also suggestive inclusive body myositis which is the most common myopathy at this age, particularly  the Cox negative fibers and the apparent ragged red fibers, but no apparent rimmed vacuoles are seen  Confirmed the history with the patient, symptoms started around 2021, he noticed difficulty picking up his current children, due to arm and leg weakness, then weakness gradually getting worse, over the past couple years he has slow worsening gait abnormality especially since he fell, injured his left shoulder requiring left rotator cuff arthroscopic surgery,  Now he also complains of intermittent right hip pain, especially with prolonged sitting or driving,  He denies muscle achy pain, he has choking episodes sometimes, but denies difficulty chewing or swallowing tough meat, mostly bothered by popcorn, even his own saliva sometimes, denies dysarthria, no double vision, continue have arm and leg weakness, fall sometimes, could not get up from low seated position   PHYSICAL EXAM:   Vitals:   07/15/22 1503  BP: (!) 142/64  Pulse: (!) 59  Weight: 164 lb 8 oz (74.6 kg)  Height: '5\' 6"'  (1.676 m)     Body mass index is 26.55 kg/m.  PHYSICAL EXAMNIATION:  Gen: NAD, conversant, well nourised, well groomed                     Cardiovascular:  Irregular rhythm Eyes: Conjunctivae clear without exudates or hemorrhage Neck: Supple, no carotid bruits. Pulmonary: Clear to auscultation bilaterally   NEUROLOGICAL EXAM:  MENTAL STATUS: Speech/cognition: Awake, alert, oriented to history taking and  casual conversation CRANIAL NERVES: CN II: Visual fields are full to confrontation. Pupils are round equal and briskly reactive to light. CN III, IV, VI: extraocular movement are normal. No ptosis. CN V: Facial sensation is intact to light touch CN VII: Face is symmetric with normal eye closure  CN VIII: Hearing is normal to causal conversation. CN IX, X: Phonation is normal. CN XI: Head turning and shoulder shrug are intact  MOTOR: Mild bilateral shoulder abduction, external rotation, elbow flexion, extension weakness, mild distal finger flexion weakness especially bilateral fourth and fifth finger   Mild bilateral hip flexion weakness, no significant ankle dorsi plantarflexion weakness,  REFLEXES: Reflexes are 2 and symmetric at the biceps, triceps, 1/1 knees, and absent at ankles. Plantar responses are flexor bilaterally  SENSORY: Length-dependent decreased to light touch, pinprick to above ankle level, hairline receding to mid shin level,  COORDINATION: There is no trunk or limb dysmetria noted.  GAIT/STANCE: Multiple effort, swinging his body to get up from seated position, wide-based, cautious  REVIEW OF SYSTEMS:  Full 14 system review of systems performed and notable only for as above All other review of systems were negative.   ALLERGIES: Allergies  Allergen Reactions   Benadryl [Diphenhydramine Hcl] Other (See Comments)    Severe Headache   Meloxicam Itching   Tramadol Itching    HOME MEDICATIONS: Current Outpatient Medications  Medication Sig Dispense Refill   acetaminophen (TYLENOL) 325 MG tablet Take 650 mg by mouth every 6 (six) hours as needed for moderate pain.     Ascorbic Acid (VITAMIN C PO) Take 1 tablet by mouth daily.     aspirin 81 MG chewable tablet Chew  81 mg by mouth daily.     Cholecalciferol (VITAMIN D3) 1000 units CAPS Take 1,000 Units by mouth daily.     finasteride (PROSCAR) 5 MG tablet Take 1 tablet by mouth daily.     MAGNESIUM PO Take 1  tablet by mouth daily.     metoprolol succinate (TOPROL-XL) 25 MG 24 hr tablet Take 25 mg by mouth daily after supper. Take with or immediately following a meal.     pantoprazole (PROTONIX) 40 MG tablet Take 40 mg by mouth daily.     rosuvastatin (CRESTOR) 20 MG tablet TAKE 1 TABLET BY MOUTH EVERY DAY (Patient taking differently: Take 20 mg by mouth daily. Patient stated he take 1/2 tablet daily) 90 tablet 3   sertraline (ZOLOFT) 50 MG tablet Take 50 mg by mouth daily.     vitamin B-12 (CYANOCOBALAMIN) 1000 MCG tablet Take 1,000 mcg by mouth daily.     zolpidem (AMBIEN) 10 MG tablet Take 10 mg by mouth at bedtime.     No current facility-administered medications for this visit.    PAST MEDICAL HISTORY: Past Medical History:  Diagnosis Date   Anxiety    Arthritis    lower back   Balance problem    CAD (coronary artery disease)    Cancer (HCC)    melanoma   Depression    GERD (gastroesophageal reflux disease)    Hyperlipidemia    Hypertension    Iron deficiency anemia    Low back pain    OSA (obstructive sleep apnea)    Pneumonia    RBBB (right bundle branch block) 10/28/2019   Noted on EKG   Tinnitus    Left ear    PAST SURGICAL HISTORY: Past Surgical History:  Procedure Laterality Date   CORONARY ARTERY BYPASS GRAFT  04/02/1999   double   DG BARIUM SWALLOW ([place] HX)     FISSURECTOMY  2006   MUSCLE BIOPSY Left 06/21/2022   Procedure: LEFT THIGH MUSCLE BIOPSY;  Surgeon: Dwan Bolt, MD;  Location: [facility];  Service: General;  Laterality: Left;   SHOULDER ARTHROSCOPY WITH ROTATOR CUFF REPAIR AND SUBACROMIAL DECOMPRESSION Left 03/22/2020   Procedure: RIGHT SHOULDER MINI OPEN ROTATOR CUFF REPAIR AND SUBACROMIAL DECOMPRESSION;  Surgeon: Susa Day, MD;  Location: [facility];  Service: Orthopedics;  Laterality: Left;  90 mins   SIGMOIDOSCOPY     SKIN CANCER EXCISION     UPPER GI ENDOSCOPY      FAMILY HISTORY: Family History  Problem Relation Age of Onset   Diabetes  Mother    Cancer Father    Cancer Sister    Cancer Sister    Cancer Brother    Cancer Maternal Grandmother    Cancer Maternal Grandfather    Heart attack Paternal Grandmother    Heart attack Paternal Grandfather     SOCIAL HISTORY: Social History   Socioeconomic History   Marital status: Married    Spouse name: Bethena Roys   Number of children: 2   Years of education: Not on file   Highest education level: 9th grade  Occupational History   Not on file  Tobacco Use   Smoking status: Former    Types: Cigarettes    Quit date: 1993    Years since quitting: 30.6   Smokeless tobacco: Former    Quit date: 11/24/1992  Vaping Use   Vaping Use: Never used  Substance and Sexual Activity   Alcohol use: No   Drug use: No   Sexual activity:  Not on file  Other Topics Concern   Not on file  Social History Narrative   04/02/22 Lives with wife   Social Determinants of Health   Financial Resource Strain: Not on file  Food Insecurity: Not on file  Transportation Needs: Not on file  Physical Activity: Not on file  Stress: Not on file  Social Connections: Not on file  Intimate Partner Violence: Not on file      Marcial Pacas, M.D. Ph.D.  [facility] [address] [place], [place] [address] Ph: [phone] Fax: [phone]  CC:  Deland Pretty, MD [address],  [place] [address]  Deland Pretty, MD

## 2022-07-26 DIAGNOSIS — Z23 Encounter for immunization: Secondary | ICD-10-CM | POA: Diagnosis not present

## 2022-08-07 ENCOUNTER — Telehealth: Payer: Self-pay | Admitting: Neurology

## 2022-08-07 DIAGNOSIS — M6281 Muscle weakness (generalized): Secondary | ICD-10-CM

## 2022-08-07 DIAGNOSIS — M25551 Pain in right hip: Secondary | ICD-10-CM

## 2022-08-07 NOTE — Telephone Encounter (Signed)
Pt is asking for a call to discuss pain he is having in his right leg, numbness as well.  Pt asking if something can be called in for him

## 2022-08-07 NOTE — Telephone Encounter (Signed)
I returned the pt's call. He reports right leg pain/numbness continue to be troublesome for him and has worsened since his last f/u.  Pt has found sitting in the care and lying on his right side to be triggers for him and make symptoms intense. He as tried otc meds such as Tylenol and Advil but no relief from these meds. Wanted to know if MD could recommend anything different?  Advised I would send to Dr. Krista Blue for review.

## 2022-08-08 MED ORDER — CELECOXIB 100 MG PO CAPS: 100.0000 mg | ORAL_CAPSULE | Freq: Two times a day (BID) | ORAL | 0 refills | Status: DC | PRN

## 2022-08-08 NOTE — Telephone Encounter (Signed)
Please call patient MRI of lumbar in June 2023 showed mild degenerative disease,  His complaints of right leg discomfort can be related to a muscle strain due to his weakness, gait abnormality  It is okay to take Tylenol/Aleve as needed, heating pad would help,  May also consider physical therapy if he wants

## 2022-08-08 NOTE — Telephone Encounter (Signed)
Pt reported in the original message he has tired tylenol/Advil without any improvement. He wanted to know if prescription medications would be helpful to help manage his symptoms?

## 2022-08-08 NOTE — Addendum Note (Signed)
Addended by: Marcial Pacas on: 08/08/2022 05:09 PM   Modules accepted: Orders

## 2022-08-08 NOTE — Telephone Encounter (Signed)
Meds ordered this encounter  Medications   celecoxib (CELEBREX) 100 MG capsule    Sig: Take 1 capsule (100 mg total) by mouth 2 (two) times daily as needed.    Dispense:  30 capsule    Refill:  0  I have written Celebrex 100 mg as needed, take it after meal, if it does not improve within 1 to 2 weeks, may consider primary care or return to clinic for evaluation

## 2022-08-08 NOTE — Telephone Encounter (Signed)
I called pt and relayed recommendation from Dr. Krista Blue. He was agreeable to this plan and will keep [place] up to date on how he fares.

## 2022-09-05 ENCOUNTER — Ambulatory Visit (INDEPENDENT_AMBULATORY_CARE_PROVIDER_SITE_OTHER): Payer: Medicare Other | Admitting: Neurology

## 2022-09-05 VITALS — BP 138/54 | HR 71

## 2022-09-05 DIAGNOSIS — I2510 Atherosclerotic heart disease of native coronary artery without angina pectoris: Secondary | ICD-10-CM | POA: Diagnosis not present

## 2022-09-05 DIAGNOSIS — G7249 Other inflammatory and immune myopathies, not elsewhere classified: Secondary | ICD-10-CM

## 2022-09-05 DIAGNOSIS — M25551 Pain in right hip: Secondary | ICD-10-CM

## 2022-09-05 DIAGNOSIS — M6281 Muscle weakness (generalized): Secondary | ICD-10-CM | POA: Diagnosis not present

## 2022-09-05 NOTE — Progress Notes (Signed)
ASSESSMENT AND PLAN  Timothy Gates is a 76 y.o. male   Gradual onset gait abnormality, Elevated CPK  MRI of cervical and lumbar spine showed no pathology to explain his current proximal lower and upper extremity weakness, gait abnormality,  Repeat CPK remains elevated, July 2023 was 1045.  EMG nerve conduction study in May 2023 showed polyphasic motor unit potential but with mildly decreased recruitment patterns, in combination with abnormal muscle biopsy with evidence of inflammatory myopathic changes, oftentimes above abnormal EMG findings seen in chronic remodeling process, can be related to the chronic myopathic disease  Muscle biopsy of left vastus lateralis in July 2023 showed marked abnormality, inflammatory myopathy,there is some features also suggestive inclusive body myositis which is the most common myopathy at this age, particularly  the Cox negative fibers and the apparent ragged red fibers, but no apparent rimmed vacuoles are seen  I discussed with the patient potential trial of treatment including steroid, immunosuppressive treatment of IVIG, he does not want to proceed  Worsening right hip pain  X-ray of right hip, previously tried Celebrex with limited help, reported allergic reaction, itching with tramadol, and NSAIDs,    DIAGNOSTIC DATA (LABS, IMAGING, TESTING) - I reviewed patient records, labs, notes, testing and imaging myself where available.   MEDICAL HISTORY:  Timothy Gates, is a 76 year old male, seen in referral by my colleague Dr. Andrey Spearman for evaluation of gait abnormality, EMG nerve conduction study, elevated CPK, his primary care physician is Dr.Pharr, Thayer Jew, MD  I reviewed and summarized the referring note.PMHX HLD Anxiety CAD, s/p CABG 2000, HTN Chronic insomnia Left rotator cuff surgery.  He noticed gradual onset of unsteady gait since 2021, feeling imbalance, only mild numbness at the bottom of his feet, he denies finger  numbness, right leg seems to give him more trouble,  He was initially found to have mild elevated CPK in 2022, 731, repeat testing in May was 1209,  Extensive laboratory evaluation otherwise normal, including normal negative ANA, Jo 1 antibody, C-reactive protein, ESR, acetylcholine receptor antibody, ANA, TSH, B12, A1c was 6.3, normal CMP, creatinine 11.7  He has occasional low back pain radiating pain to right lower extremity, denies significant neck pain, denies bowel or bladder incontinence  He denies double vision, droopy eyelid, swallowing difficulties  Known history of cervical degenerative changes, MRI of cervical spine in 2008 showed multilevel degenerative changes, most noticeable at lower cervical region, moderate right foraminal narrowing C6-7, C5-6, no evidence of cord compression  MRI of lumbar spine from orthopedic clinic reported multilevel foraminal stenosis  UPDATE May 16 2022: Continue complains significant gait abnormality, has to get up from seated position, also noticed mild bilateral proximal upper extremity fairly symmetric weakness, no distal weakness,  We personally reviewed MRI of the cervical spine, on May 09, 2022, lower cervical degenerative changes no significant canal narrowing, no evidence of cord compression, 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  MRI of the lumbar spine, mild degenerative changes, no evidence of canal and foraminal narrowing  Extensive laboratory evaluation showed continued elevation of CPK 1035, LDH 303,, negative MuSK antibody, acetylcholine receptor antibody, thyroid functional test, CBC CMP, TSH, B12, ANA, anti-Jo1 antibody  I reviewed extensive medication list, he has been on different statin treatment since 2016, Pravachol, 40 mg, Crestor 20 mg since January 2023 He denies significant muscle achy pain  Repeat the needle examination of the right deltoid, vastus lateralis, right tibialis anterior, continue with evidence of normal  morphology motor unit potential, with  complex polyspike motor unit potential, with actually slight decrease including in patterns, I was not able to appreciate early recruitment, there was no typical small polyphasic myopathic motor unit potentials noted  Update July 15, 2022: He underwent left vastus lateralis muscle biopsy on June 21, 2022, which showed marked abnormality, consistent with inflammatory myopathy, there is some features also suggestive inclusive body myositis which is the most common myopathy at this age, particularly  the Cox negative fibers and the apparent ragged red fibers, but no apparent rimmed vacuoles are seen  Confirmed the history with the patient, symptoms started around 2021, he noticed difficulty picking up his current children, due to arm and leg weakness, then weakness gradually getting worse, over the past couple years he has slow worsening gait abnormality especially since he fell, injured his left shoulder requiring left rotator cuff arthroscopic surgery,  Now he also complains of intermittent right hip pain, especially with prolonged sitting or driving,  He denies muscle achy pain, he has choking episodes sometimes, but denies difficulty chewing or swallowing tough meat, mostly bothered by popcorn, even his own saliva sometimes, denies dysarthria, no double vision, continue have arm and leg weakness, fall sometimes, could not get up from low seated position   PHYSICAL EXAM:   Today's Vitals   09/05/22 1823  BP: (!) 138/54  Pulse: 71   There is no height or weight on file to calculate BMI.   Gen: NAD, conversant, well nourised, well groomed                     Cardiovascular:  Irregular rhythm    NEUROLOGICAL EXAM:  MENTAL STATUS: Speech/cognition: Awake, alert, oriented to history taking and casual conversation CRANIAL NERVES: CN II: Visual fields are full to confrontation. Pupils are round equal and briskly reactive to light. CN III, IV, VI:  extraocular movement are normal. No ptosis. CN V: Facial sensation is intact to light touch CN VII: Face is symmetric with normal eye closure  CN VIII: Hearing is normal to causal conversation. CN IX, X: Phonation is normal. CN XI: Head turning and shoulder shrug are intact  MOTOR: Mild bilateral shoulder abduction, hip flexion weakness, Significant right hip pain when crossing leg  REFLEXES: Reflexes are 2 and symmetric at the biceps, triceps, 1/1 knees, and absent at ankles. Plantar responses are flexor bilaterally  SENSORY: Length-dependent decreased to light touch, pinprick to above ankle level, hairline receding to mid shin level,  COORDINATION: There is no trunk or limb dysmetria noted.  GAIT/STANCE: Need push up to get up from seated position, unsteady, mildly  REVIEW OF SYSTEMS:  Full 14 system review of systems performed and notable only for as above All other review of systems were negative.   ALLERGIES: Allergies  Allergen Reactions   Benadryl [Diphenhydramine Hcl] Other (See Comments)    Severe Headache   Meloxicam Itching   Tramadol Itching    HOME MEDICATIONS: Current Outpatient Medications  Medication Sig Dispense Refill   acetaminophen (TYLENOL) 325 MG tablet Take 650 mg by mouth every 6 (six) hours as needed for moderate pain.     Ascorbic Acid (VITAMIN C PO) Take 1 tablet by mouth daily.     aspirin 81 MG chewable tablet Chew 81 mg by mouth daily.     celecoxib (CELEBREX) 100 MG capsule Take 1 capsule (100 mg total) by mouth 2 (two) times daily as needed. 30 capsule 0   Cholecalciferol (VITAMIN D3) 1000 units CAPS Take 1,000 Units  by mouth daily.     finasteride (PROSCAR) 5 MG tablet Take 1 tablet by mouth daily.     MAGNESIUM PO Take 1 tablet by mouth daily.     metoprolol succinate (TOPROL-XL) 25 MG 24 hr tablet Take 25 mg by mouth daily after supper. Take with or immediately following a meal.     pantoprazole (PROTONIX) 40 MG tablet Take 40 mg by  mouth daily.     rosuvastatin (CRESTOR) 20 MG tablet TAKE 1 TABLET BY MOUTH EVERY DAY (Patient taking differently: Take 20 mg by mouth daily. Patient stated he take 1/2 tablet daily) 90 tablet 3   sertraline (ZOLOFT) 50 MG tablet Take 50 mg by mouth daily.     vitamin B-12 (CYANOCOBALAMIN) 1000 MCG tablet Take 1,000 mcg by mouth daily.     zolpidem (AMBIEN) 10 MG tablet Take 10 mg by mouth at bedtime.     No current facility-administered medications for this visit.    PAST MEDICAL HISTORY: Past Medical History:  Diagnosis Date   Anxiety    Arthritis    lower back   Balance problem    CAD (coronary artery disease)    Cancer (HCC)    melanoma   Depression    GERD (gastroesophageal reflux disease)    Hyperlipidemia    Hypertension    Iron deficiency anemia    Low back pain    OSA (obstructive sleep apnea)    Pneumonia    RBBB (right bundle branch block) 10/28/2019   Noted on EKG   Tinnitus    Left ear    PAST SURGICAL HISTORY: Past Surgical History:  Procedure Laterality Date   CORONARY ARTERY BYPASS GRAFT  04/02/1999   double   DG BARIUM SWALLOW ([place] HX)     FISSURECTOMY  2006   MUSCLE BIOPSY Left 06/21/2022   Procedure: LEFT THIGH MUSCLE BIOPSY;  Surgeon: Dwan Bolt, MD;  Location: [facility];  Service: General;  Laterality: Left;   SHOULDER ARTHROSCOPY WITH ROTATOR CUFF REPAIR AND SUBACROMIAL DECOMPRESSION Left 03/22/2020   Procedure: RIGHT SHOULDER MINI OPEN ROTATOR CUFF REPAIR AND SUBACROMIAL DECOMPRESSION;  Surgeon: Susa Day, MD;  Location: [facility];  Service: Orthopedics;  Laterality: Left;  90 mins   SIGMOIDOSCOPY     SKIN CANCER EXCISION     UPPER GI ENDOSCOPY      FAMILY HISTORY: Family History  Problem Relation Age of Onset   Diabetes Mother    Cancer Father    Cancer Sister    Cancer Sister    Cancer Brother    Cancer Maternal Grandmother    Cancer Maternal Grandfather    Heart attack Paternal Grandmother    Heart attack Paternal Grandfather      SOCIAL HISTORY: Social History   Socioeconomic History   Marital status: Married    Spouse name: Bethena Roys   Number of children: 2   Years of education: Not on file   Highest education level: 9th grade  Occupational History   Not on file  Tobacco Use   Smoking status: Former    Types: Cigarettes    Quit date: 1993    Years since quitting: 30.7   Smokeless tobacco: Former    Quit date: 11/24/1992  Vaping Use   Vaping Use: Never used  Substance and Sexual Activity   Alcohol use: No   Drug use: No   Sexual activity: Not on file  Other Topics Concern   Not on file  Social History Narrative  04/02/22 Lives with wife   Social Determinants of Health   Financial Resource Strain: Not on file  Food Insecurity: Not on file  Transportation Needs: Not on file  Physical Activity: Not on file  Stress: Not on file  Social Connections: Not on file  Intimate Partner Violence: Not on file      Marcial Pacas, M.D. Ph.D.  [facility] [address], [place], [place] [address] Ph: [phone] Fax: [phone]  CC:  Deland Pretty, MD [place],   [id number]  Deland Pretty, MD

## 2022-10-10 ENCOUNTER — Telehealth: Payer: Self-pay | Admitting: Neurology

## 2022-10-10 NOTE — Telephone Encounter (Signed)
Pt called needing to be advised by RN on the pain he has been having on his L leg and also about his balance that has caused falls. Please advise.

## 2022-10-10 NOTE — Telephone Encounter (Signed)
Attempted to call pt, LVM for call back  °

## 2022-10-16 NOTE — Telephone Encounter (Signed)
We have attempted to reach the pt, no CB. Will wait to hear from the pt at this point.

## 2022-10-25 DIAGNOSIS — D649 Anemia, unspecified: Secondary | ICD-10-CM | POA: Diagnosis not present

## 2022-10-25 DIAGNOSIS — E785 Hyperlipidemia, unspecified: Secondary | ICD-10-CM | POA: Diagnosis not present

## 2022-10-25 DIAGNOSIS — R7301 Impaired fasting glucose: Secondary | ICD-10-CM | POA: Diagnosis not present

## 2022-11-01 DIAGNOSIS — K219 Gastro-esophageal reflux disease without esophagitis: Secondary | ICD-10-CM | POA: Diagnosis not present

## 2022-11-01 DIAGNOSIS — G4700 Insomnia, unspecified: Secondary | ICD-10-CM | POA: Diagnosis not present

## 2022-11-01 DIAGNOSIS — I10 Essential (primary) hypertension: Secondary | ICD-10-CM | POA: Diagnosis not present

## 2022-11-01 DIAGNOSIS — I2510 Atherosclerotic heart disease of native coronary artery without angina pectoris: Secondary | ICD-10-CM | POA: Diagnosis not present

## 2022-11-01 DIAGNOSIS — R7303 Prediabetes: Secondary | ICD-10-CM | POA: Diagnosis not present

## 2022-11-01 DIAGNOSIS — D509 Iron deficiency anemia, unspecified: Secondary | ICD-10-CM | POA: Diagnosis not present

## 2022-11-01 DIAGNOSIS — E789 Disorder of lipoprotein metabolism, unspecified: Secondary | ICD-10-CM | POA: Diagnosis not present

## 2022-11-08 DIAGNOSIS — M7061 Trochanteric bursitis, right hip: Secondary | ICD-10-CM | POA: Diagnosis not present

## 2022-11-08 DIAGNOSIS — M542 Cervicalgia: Secondary | ICD-10-CM | POA: Diagnosis not present

## 2022-11-08 DIAGNOSIS — M545 Low back pain, unspecified: Secondary | ICD-10-CM | POA: Diagnosis not present

## 2022-11-08 DIAGNOSIS — M25551 Pain in right hip: Secondary | ICD-10-CM | POA: Diagnosis not present

## 2022-11-20 DIAGNOSIS — M25551 Pain in right hip: Secondary | ICD-10-CM | POA: Diagnosis not present

## 2022-12-25 DIAGNOSIS — M25551 Pain in right hip: Secondary | ICD-10-CM | POA: Diagnosis not present

## 2023-03-05 DIAGNOSIS — N451 Epididymitis: Secondary | ICD-10-CM | POA: Diagnosis not present

## 2023-03-05 DIAGNOSIS — N400 Enlarged prostate without lower urinary tract symptoms: Secondary | ICD-10-CM | POA: Diagnosis not present

## 2023-04-22 DIAGNOSIS — E7800 Pure hypercholesterolemia, unspecified: Secondary | ICD-10-CM | POA: Diagnosis not present

## 2023-04-22 DIAGNOSIS — Z8639 Personal history of other endocrine, nutritional and metabolic disease: Secondary | ICD-10-CM | POA: Diagnosis not present

## 2023-04-22 DIAGNOSIS — I10 Essential (primary) hypertension: Secondary | ICD-10-CM | POA: Diagnosis not present

## 2023-04-22 DIAGNOSIS — G4700 Insomnia, unspecified: Secondary | ICD-10-CM | POA: Diagnosis not present

## 2023-04-22 DIAGNOSIS — R7301 Impaired fasting glucose: Secondary | ICD-10-CM | POA: Diagnosis not present

## 2023-04-22 LAB — LAB REPORT - SCANNED
A1c: 6.1
EGFR: 90

## 2023-04-23 DIAGNOSIS — L820 Inflamed seborrheic keratosis: Secondary | ICD-10-CM | POA: Diagnosis not present

## 2023-04-23 DIAGNOSIS — L570 Actinic keratosis: Secondary | ICD-10-CM | POA: Diagnosis not present

## 2023-04-23 DIAGNOSIS — L821 Other seborrheic keratosis: Secondary | ICD-10-CM | POA: Diagnosis not present

## 2023-04-23 DIAGNOSIS — L298 Other pruritus: Secondary | ICD-10-CM | POA: Diagnosis not present

## 2023-04-23 DIAGNOSIS — Z85828 Personal history of other malignant neoplasm of skin: Secondary | ICD-10-CM | POA: Diagnosis not present

## 2023-04-28 DIAGNOSIS — Z8639 Personal history of other endocrine, nutritional and metabolic disease: Secondary | ICD-10-CM | POA: Diagnosis not present

## 2023-04-28 DIAGNOSIS — I10 Essential (primary) hypertension: Secondary | ICD-10-CM | POA: Diagnosis not present

## 2023-04-28 DIAGNOSIS — I2510 Atherosclerotic heart disease of native coronary artery without angina pectoris: Secondary | ICD-10-CM | POA: Diagnosis not present

## 2023-04-28 DIAGNOSIS — E785 Hyperlipidemia, unspecified: Secondary | ICD-10-CM | POA: Diagnosis not present

## 2023-04-28 DIAGNOSIS — Z0000 Encounter for general adult medical examination without abnormal findings: Secondary | ICD-10-CM | POA: Diagnosis not present

## 2023-04-28 DIAGNOSIS — D509 Iron deficiency anemia, unspecified: Secondary | ICD-10-CM | POA: Diagnosis not present

## 2023-04-28 DIAGNOSIS — R7303 Prediabetes: Secondary | ICD-10-CM | POA: Diagnosis not present

## 2023-04-28 DIAGNOSIS — E559 Vitamin D deficiency, unspecified: Secondary | ICD-10-CM | POA: Diagnosis not present

## 2023-06-04 ENCOUNTER — Encounter: Payer: Self-pay | Admitting: Cardiovascular Disease

## 2023-06-04 ENCOUNTER — Ambulatory Visit: Payer: Medicare Other | Attending: Cardiovascular Disease | Admitting: Cardiovascular Disease

## 2023-06-04 VITALS — BP 140/62 | HR 55 | Ht 66.0 in | Wt 168.0 lb

## 2023-06-04 DIAGNOSIS — G4733 Obstructive sleep apnea (adult) (pediatric): Secondary | ICD-10-CM | POA: Diagnosis not present

## 2023-06-04 DIAGNOSIS — E785 Hyperlipidemia, unspecified: Secondary | ICD-10-CM | POA: Insufficient documentation

## 2023-06-04 DIAGNOSIS — I4510 Unspecified right bundle-branch block: Secondary | ICD-10-CM | POA: Insufficient documentation

## 2023-06-04 DIAGNOSIS — I2510 Atherosclerotic heart disease of native coronary artery without angina pectoris: Secondary | ICD-10-CM | POA: Insufficient documentation

## 2023-06-04 DIAGNOSIS — G5603 Carpal tunnel syndrome, bilateral upper limbs: Secondary | ICD-10-CM | POA: Diagnosis not present

## 2023-06-04 DIAGNOSIS — R079 Chest pain, unspecified: Secondary | ICD-10-CM | POA: Diagnosis not present

## 2023-06-04 DIAGNOSIS — M5417 Radiculopathy, lumbosacral region: Secondary | ICD-10-CM | POA: Insufficient documentation

## 2023-06-04 DIAGNOSIS — G7249 Other inflammatory and immune myopathies, not elsewhere classified: Secondary | ICD-10-CM | POA: Insufficient documentation

## 2023-06-04 NOTE — Progress Notes (Signed)
.    Cardiology Office Note    Date:  06/04/2023   ID:  Timothy Gates, DOB 13-Feb-1946, MRN [id number]  PCP:  Merri Brunette, MD  Cardiologist:  Thurmon Fair, MD   Chief Complaint  Patient presents with   muscle weakness   Coronary Artery Disease    History of Present Illness:  Timothy Gates is a 77 y.o. male with history of coronary artery disease and bypass surgery with all arterial conduits (sequential LIMA to LAD, Dr. Dorris Fetch, 2000), obstructive sleep apnea and hyperlipidemia returning for routine follow-up.  He does not have angina, dyspnea, palpitations, edema, claudication or other cardiovascular complaints.  He has not had syncope or dizziness.  He has had falls without change in consciousness.  About a year ago he developed significant muscle weakness so he had difficulty raising from a seated position or when he was scratching in his garden.  This has only worsened.  He has had several falls.  In vision, it is now sometimes difficult to even raise his arms to shoulder level.    He complains of pain with a typical sciatica pattern especially in his right buttock/right posterior thigh.  He also complains of neuropathic symptoms in his feet, he describes a feeling that he is "walking on cement blocks".  Stopping his statin for 3 months had no benefit and labs showed significant elevation in creatine kinase.  He underwent a muscle biopsy that showed evidence of inflammatory myositis.  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  He was offered treatment with immunosuppression by Dr. Terrace Arabia in the neurology clinic, but he declined due to concerns about possible side effects.  He also had nerve conduction studies that showed evidence of significant bilateral lumbosacral radiculopathy, worse on the right, primarily L4-L5-S1 distribution.  He also had evidence of bilateral cervical radiculopathy involving  the distal cervical myotomes and evidence of bilateral moderate to severe   Stopping the pravastatin for 3 months has led to no appreciable difference in his muscle weakness.  He has been on this medication for over 20 years, ever since his bypass procedure.         -  EMG nerve conduction study in May 2023 showed polyphasic motor unit potential but with mildly decreased recruitment patterns, in combination with abnormal muscle biopsy with evidence of inflammatory myopathic changes, oftentimes above abnormal EMG findings seen in chronic remodeling process, can be related to the chronic myopathic disease       -    Muscle biopsy of left vastus lateralis in July 2023 showed marked abnormality, inflammatory myopathy,there is some features also suggestive inclusion body myositis which is the most common myopathy at this age, particularly  the Cox negative fibers and the apparent ragged red fibers, but no apparent rimmed vacuoles are seen  Past Medical History:  Diagnosis Date   Anxiety    Arthritis    lower back   Balance problem    CAD (coronary artery disease)    Cancer (HCC)    melanoma   Depression    GERD (gastroesophageal reflux disease)    Hyperlipidemia    Hypertension    Iron deficiency anemia    Low back pain    OSA (obstructive sleep apnea)    Pneumonia    RBBB (right bundle branch block) 10/28/2019   Noted on EKG   Tinnitus    Left ear    Past Surgical  History:  Procedure Laterality Date   CORONARY ARTERY BYPASS GRAFT  04/02/1999   double   DG BARIUM SWALLOW ([facility] HX)     FISSURECTOMY  2006   MUSCLE BIOPSY Left 06/21/2022   Procedure: LEFT THIGH MUSCLE BIOPSY;  Surgeon: Fritzi Mandes, MD;  Location: [facility];  Service: General;  Laterality: Left;   SHOULDER ARTHROSCOPY WITH ROTATOR CUFF REPAIR AND SUBACROMIAL DECOMPRESSION Left 03/22/2020   Procedure: RIGHT SHOULDER MINI OPEN ROTATOR CUFF REPAIR AND SUBACROMIAL DECOMPRESSION;  Surgeon: Jene Every, MD;  Location: [facility];  Service: Orthopedics;  Laterality: Left;  90 mins   SIGMOIDOSCOPY     SKIN CANCER EXCISION     UPPER GI ENDOSCOPY      Current Medications: Outpatient Medications Prior to Visit  Medication Sig Dispense Refill   Ascorbic Acid (VITAMIN C PO) Take 1 tablet by mouth daily.     aspirin 81 MG chewable tablet Chew 81 mg by mouth daily.     Cholecalciferol (VITAMIN D3) 1000 units CAPS Take 1,000 Units by mouth daily.     finasteride (PROSCAR) 5 MG tablet Take 1 tablet by mouth daily.     metoprolol succinate (TOPROL-XL) 25 MG 24 hr tablet Take 25 mg by mouth daily after supper. Take with or immediately following a meal.     pantoprazole (PROTONIX) 40 MG tablet Take 40 mg by mouth daily.     rosuvastatin (CRESTOR) 20 MG tablet TAKE 1 TABLET BY MOUTH EVERY DAY (Patient taking differently: Take 20 mg by mouth daily. Patient stated he take 1/2 tablet daily) 90 tablet 3   sertraline (ZOLOFT) 50 MG tablet Take 50 mg by mouth daily.     vitamin B-12 (CYANOCOBALAMIN) 1000 MCG tablet Take 1,000 mcg by mouth daily.     zolpidem (AMBIEN) 10 MG tablet Take 10 mg by mouth at bedtime.     acetaminophen (TYLENOL) 325 MG tablet Take 650 mg by mouth every 6 (six) hours as needed for moderate pain. (Patient not taking: Reported on 06/04/2023)     benzonatate (TESSALON) 100 MG capsule Take 100 mg by mouth 3 (three) times daily as needed. (Patient not taking: Reported on 06/04/2023)     MAGNESIUM PO Take 1 tablet by mouth daily. (Patient not taking: Reported on 06/04/2023)     No facility-administered medications prior to visit.     Allergies:   Benadryl [diphenhydramine hcl], Meloxicam, and Tramadol   Social History   Socioeconomic History   Marital status: Married    Spouse name: [name]   Number of children: 2   Years of education: Not on file   Highest education level: 9th grade  Occupational History   Not on file  Tobacco Use   Smoking status: Former    Types: Cigarettes    Quit date: 1993     Years since quitting: 31.5   Smokeless tobacco: Former    Quit date: 11/24/1992  Vaping Use   Vaping Use: Never used  Substance and Sexual Activity   Alcohol use: No   Drug use: No   Sexual activity: Not on file  Other Topics Concern   Not on file  Social History Narrative   04/02/22 Lives with wife   Social Determinants of Health   Financial Resource Strain: Not on file  Food Insecurity: Not on file  Transportation Needs: Not on file  Physical Activity: Not on file  Stress: Not on file  Social Connections: Not on file     Family History:  The patient's family history includes Cancer in his brother, father, maternal grandfather, maternal grandmother, sister, and sister; Diabetes in his mother; Heart attack in his paternal grandfather and paternal grandmother.   ROS:   Please see the history of present illness.    ROS all other systems are reviewed and are negative   PHYSICAL EXAM:   VS:  BP (!) 140/62 (BP Location: Left Arm, Patient Position: Sitting, Cuff Size: Normal)   Pulse (!) 55   Ht 5\' 6"  (1.676 m)   Wt 168 lb (76.2 kg)   SpO2 96%   BMI 27.12 kg/m      General: Alert, oriented x3, no distress, minimally overweight Head: no evidence of trauma, PERRL, EOMI, no exophtalmos or lid lag, no myxedema, no xanthelasma; normal ears, nose and oropharynx Neck: normal jugular venous pulsations and no hepatojugular reflux; brisk carotid pulses without delay and no carotid bruits Chest: clear to auscultation, no signs of consolidation by percussion or palpation, normal fremitus, symmetrical and full respiratory excursions Cardiovascular: normal position and quality of the apical impulse, regular rhythm, normal first and second heart sounds, no murmurs, rubs or gallops Abdomen: no tenderness or distention, no masses by palpation, no abnormal pulsatility or arterial bruits, normal bowel sounds, no hepatosplenomegaly Extremities: no clubbing, cyanosis or edema; 2+ radial, ulnar and  brachial pulses bilaterally; 2+ right femoral, posterior tibial and dorsalis pedis pulses; 2+ left femoral, posterior tibial and dorsalis pedis pulses; no subclavian or femoral bruits Neurological: grossly nonfocal.  Uses his arms to push himself out of a chair to stand from seated position Psych: Normal mood and affect     Wt Readings from Last 3 Encounters:  06/04/23 168 lb (76.2 kg)  07/15/22 164 lb 8 oz (74.6 kg)  06/21/22 163 lb (73.9 kg)     Studies/Labs Reviewed:   EKG:  Ordered today and shows sinus rhythm with right bundle branch block, QTc is normal at 430 ms.  Is not meaningfully changed from previous tracings. Recent Labs: Reviewed report from his muscle biopsy. CK was persistently level between 564 and 1209 between January 2022 and June 2023 even when statins were discontinued for 3 months. Aldolase was elevated at 15.7 (04/02/2022).  LDH was 300-340 Last June CRP and ESR were normal.  Autoimmune workup including anticholinesterase receptor antibodies, ANA, anti-Jo1 antibodies were all negative.  Lipid Panel 10/04/2019 total cholesterol 158, triglycerides 117, HDL 29, LDL 92 03/30/2019 total cholesterol 144, triglycerides 232, HDL 27, LDL 71 10/09/2020 total cholesterol 167, triglycerides 155, HDL 32, LDL 107 06/01/2021 total cholesterol 115, triglycerides 166, HDL 27, LDL 60  ASSESSMENT:    1. Coronary artery disease involving native coronary artery of native heart without angina pectoris   2. Dyslipidemia (high LDL; low HDL)   3. OSA (obstructive sleep apnea)   4. RBBB   5. Inflammatory myopathy   6. Carpal tunnel syndrome, bilateral   7. Lumbosacral radiculopathy      PLAN:  In order of problems listed above:  CAD s/p CABG: Asymptomatic.  On aspirin.  LDL cholesterol on statin is in target range.  Chronically low HDL cholesterol.  HLP: Has a chronically low HDL level.  Stopping the pravastatin led to no improvement in his muscle weakness complaints.  Since  his LDL is not at goal, will try rosuvastatin 20 mg daily.  Check CK on follow-up labs. OSA: No longer using CPAP after weight loss. He does not have any symptoms of daytime hypersomnolence. RBBB(+LPFB): Stable findings on ECG.  The posterior fascicular block is intermittent.  No symptoms of high-grade AV conduction abnormalities. 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  He does not have evidence to support Cushing's disease, Eaton-Lambert syndrome or autoimmune polymyositis.  He may have inclusion body myositis in addition to cervical and lumbar radiculopathy and carpal tunnel syndrome.  Reviewed his ECG and echocardiogram again.  He does not have any findings that would support a diagnosis of cardiac amyloidosis.  Strongly encouraged him to consider treatment for his polymyositis since this likely that his weakness will only worsen and could result in serious consequences.  He is reluctant to consider aggressive treatment at age 34, although he is upset that he cannot crouch down to play with his great-grandchildren.  He seems to be rather fatalistic about it.  Suggest that he should consult with a rheumatologist and recommended Dr. Dierdre Forth.  Will repeat his labs today.  If the CK is still markedly elevated will make a referral to rheumatology.  Medication Adjustments/Labs and Tests Ordered: Current medicines are reviewed at length with the patient today.  Concerns regarding medicines are outlined above.  Medication changes, Labs and Tests ordered today are listed in the Patient Instructions below. Patient Instructions  Medication Instructions:  No changes *If you need a refill on your cardiac medications before your next appointment, please call your pharmacy*   Lab Work: CK, ESR, CRP, Aldolase- today If you have labs (blood work) drawn today and your tests are completely normal, you will receive your results only by: MyChart Message (if you have MyChart)  OR A paper copy in the mail If you have any lab test that is abnormal or we need to change your treatment, we will call you to review the results.  Follow-Up: At [facility], you and your health needs are our priority.  As part of our continuing mission to provide you with exceptional heart care, we have created designated Provider Care Teams.  These Care Teams include your primary Cardiologist (physician) and Advanced Practice Providers (APPs -  Physician Assistants and Nurse Practitioners) who all work together to provide you with the care you need, when you need it.  We recommend signing up for the patient portal called "MyChart".  Sign up information is provided on this After Visit Summary.  MyChart is used to connect with patients for Virtual Visits (Telemedicine).  Patients are able to view lab/test results, encounter notes, upcoming appointments, etc.  Non-urgent messages can be sent to your provider as well.   To learn more about what you can do with MyChart, go to [email or web address].    Your next appointment:   1 year(s)  Provider:   Thurmon Fair, MD       Signed, Thurmon Fair, MD  06/04/2023 6:53 PM    [facility] [address] [place], [place], [place]  [address] Phone: [phone]; Fax: [phone]

## 2023-06-04 NOTE — Patient Instructions (Signed)
Medication Instructions:  No changes *If you need a refill on your cardiac medications before your next appointment, please call your pharmacy*   Lab Work: CK, ESR, CRP, Aldolase- today If you have labs (blood work) drawn today and your tests are completely normal, you will receive your results only by: MyChart Message (if you have MyChart) OR A paper copy in the mail If you have any lab test that is abnormal or we need to change your treatment, we will call you to review the results.  Follow-Up: At [facility], you and your health needs are our priority.  As part of our continuing mission to provide you with exceptional heart care, we have created designated Provider Care Teams.  These Care Teams include your primary Cardiologist (physician) and Advanced Practice Providers (APPs -  Physician Assistants and Nurse Practitioners) who all work together to provide you with the care you need, when you need it.  We recommend signing up for the patient portal called "MyChart".  Sign up information is provided on this After Visit Summary.  MyChart is used to connect with patients for Virtual Visits (Telemedicine).  Patients are able to view lab/test results, encounter notes, upcoming appointments, etc.  Non-urgent messages can be sent to your provider as well.   To learn more about what you can do with MyChart, go to [email or web address].    Your next appointment:   1 year(s)  Provider:   Thurmon Fair, MD

## 2023-06-05 ENCOUNTER — Telehealth: Payer: Self-pay | Admitting: Cardiovascular Disease

## 2023-06-05 LAB — ALDOLASE: Aldolase: 19.7 U/L — ABNORMAL HIGH (ref 3.3–10.3)

## 2023-06-05 LAB — CK: Total CK: 997 U/L (ref 41–331)

## 2023-06-05 LAB — SEDIMENTATION RATE: Sed Rate: 9 mm/hr (ref 0–30)

## 2023-06-05 LAB — C-REACTIVE PROTEIN: CRP: 2 mg/L (ref 0–10)

## 2023-06-05 NOTE — Telephone Encounter (Signed)
Pt called in, he reviewed lab results via mychart and would like to discuss further about the referral to [facility].

## 2023-06-05 NOTE — Telephone Encounter (Signed)
Patient states he cannot go to [facility] as it is too far.  Patient states Dr C mentioned a place on [address] This is for the reduce strength in his legs and nerves. He states he would be able to go to that location.  Please advise

## 2023-06-06 NOTE — Telephone Encounter (Signed)
I will reach out to the rheumatologist, Dr. Dierdre Forth and see if he thinks that he can help.

## 2023-06-12 NOTE — Telephone Encounter (Signed)
Reaching out again gto Rheumatology - have not heard back

## 2023-06-18 ENCOUNTER — Telehealth: Payer: Self-pay | Admitting: Cardiovascular Disease

## 2023-06-18 NOTE — Telephone Encounter (Signed)
A referral was sent to them and they are missing the patient demo and other documents. Please call back Dorothy [phone] Ext. [phone]

## 2023-06-18 NOTE — Telephone Encounter (Signed)
Faxed recent labs and insurance card to office.

## 2023-06-19 NOTE — Telephone Encounter (Signed)
Lets go ahead please.  I still have not heard back.

## 2023-06-20 ENCOUNTER — Other Ambulatory Visit: Payer: Self-pay | Admitting: Emergency Medicine

## 2023-06-20 DIAGNOSIS — M6281 Muscle weakness (generalized): Secondary | ICD-10-CM

## 2023-06-20 DIAGNOSIS — G7241 Inclusion body myositis [IBM]: Secondary | ICD-10-CM

## 2023-06-20 NOTE — Telephone Encounter (Signed)
Called pt and informed him of a referral placed to [facility] Rheumatology: Dr Dierdre Forth [address] [place], [place] [address] Phone [phone] Fax [phone]  Pt wrote down the information and verbalized understanding.   Sent info in MyChart to patient as well. (The referral/order was placed in an Orders Only encounter 06/20/23)

## 2023-06-20 NOTE — Progress Notes (Signed)
Referral placed to Rheumatology- Dr Dierdre Forth

## 2023-06-24 ENCOUNTER — Telehealth: Payer: Self-pay | Admitting: Cardiovascular Disease

## 2023-06-24 NOTE — Telephone Encounter (Signed)
He was referred to Dr Dierdre Forth, Rheumatology. I have sent my note and the muscle biopsy report to Dr. Dierdre Forth via Epic. It is the Surgical Pathology report date 06/21/2022 in Epic. If necessary, please print and fax it and my last note.

## 2023-06-24 NOTE — Telephone Encounter (Signed)
Calling to say that they need muscle biopsy for the patient sent over. Please advise

## 2023-06-24 NOTE — Telephone Encounter (Signed)
Faxed a copy of muscle biopsy to Dr. Shawnee Knapp office.Called pt to let him know but Unable to LVM as it is full.

## 2023-06-24 NOTE — Telephone Encounter (Signed)
Returned pt call. Pt states he still has not been set up with who Dr. Salena Saner referred him to. They need his muscle biopsy sent over as that office does not have it.  Pt is unsure of the name of the office he is to go too.

## 2023-06-25 NOTE — Telephone Encounter (Signed)
Faxed Patient Demographics sheet/info to [facility] Rheumatology

## 2023-07-01 DIAGNOSIS — Z6826 Body mass index (BMI) 26.0-26.9, adult: Secondary | ICD-10-CM | POA: Diagnosis not present

## 2023-07-01 DIAGNOSIS — M5416 Radiculopathy, lumbar region: Secondary | ICD-10-CM | POA: Diagnosis not present

## 2023-07-01 DIAGNOSIS — R29898 Other symptoms and signs involving the musculoskeletal system: Secondary | ICD-10-CM | POA: Diagnosis not present

## 2023-07-01 DIAGNOSIS — R5382 Chronic fatigue, unspecified: Secondary | ICD-10-CM | POA: Diagnosis not present

## 2023-07-01 DIAGNOSIS — E663 Overweight: Secondary | ICD-10-CM | POA: Diagnosis not present

## 2023-07-29 DIAGNOSIS — R29898 Other symptoms and signs involving the musculoskeletal system: Secondary | ICD-10-CM | POA: Diagnosis not present

## 2023-07-29 DIAGNOSIS — R5382 Chronic fatigue, unspecified: Secondary | ICD-10-CM | POA: Diagnosis not present

## 2023-07-29 DIAGNOSIS — Z6826 Body mass index (BMI) 26.0-26.9, adult: Secondary | ICD-10-CM | POA: Diagnosis not present

## 2023-07-29 DIAGNOSIS — M5416 Radiculopathy, lumbar region: Secondary | ICD-10-CM | POA: Diagnosis not present

## 2023-07-29 DIAGNOSIS — E663 Overweight: Secondary | ICD-10-CM | POA: Diagnosis not present

## 2023-08-16 ENCOUNTER — Other Ambulatory Visit: Payer: Self-pay

## 2023-08-16 ENCOUNTER — Emergency Department (HOSPITAL_BASED_OUTPATIENT_CLINIC_OR_DEPARTMENT_OTHER)
Admission: EM | Admit: 2023-08-16 | Discharge: 2023-08-17 | Disposition: A | Discharge status: Home / Self Care | Payer: Medicare Other | Source: Home / Self Care | Attending: Emergency Medicine | Admitting: Emergency Medicine

## 2023-08-16 DIAGNOSIS — R7989 Other specified abnormal findings of blood chemistry: Secondary | ICD-10-CM | POA: Diagnosis not present

## 2023-08-16 DIAGNOSIS — I82401 Acute embolism and thrombosis of unspecified deep veins of right lower extremity: Secondary | ICD-10-CM | POA: Diagnosis not present

## 2023-08-16 DIAGNOSIS — I2510 Atherosclerotic heart disease of native coronary artery without angina pectoris: Secondary | ICD-10-CM | POA: Diagnosis not present

## 2023-08-16 DIAGNOSIS — R600 Localized edema: Secondary | ICD-10-CM | POA: Insufficient documentation

## 2023-08-16 DIAGNOSIS — I824Y1 Acute embolism and thrombosis of unspecified deep veins of right proximal lower extremity: Secondary | ICD-10-CM | POA: Diagnosis not present

## 2023-08-16 DIAGNOSIS — Z7982 Long term (current) use of aspirin: Secondary | ICD-10-CM | POA: Insufficient documentation

## 2023-08-16 DIAGNOSIS — Z85828 Personal history of other malignant neoplasm of skin: Secondary | ICD-10-CM | POA: Diagnosis not present

## 2023-08-16 DIAGNOSIS — I10 Essential (primary) hypertension: Secondary | ICD-10-CM | POA: Diagnosis not present

## 2023-08-16 DIAGNOSIS — G7249 Other inflammatory and immune myopathies, not elsewhere classified: Secondary | ICD-10-CM | POA: Diagnosis not present

## 2023-08-16 DIAGNOSIS — Z955 Presence of coronary angioplasty implant and graft: Secondary | ICD-10-CM | POA: Diagnosis not present

## 2023-08-16 DIAGNOSIS — M7989 Other specified soft tissue disorders: Secondary | ICD-10-CM | POA: Insufficient documentation

## 2023-08-16 DIAGNOSIS — Z951 Presence of aortocoronary bypass graft: Secondary | ICD-10-CM | POA: Diagnosis not present

## 2023-08-16 DIAGNOSIS — Z79899 Other long term (current) drug therapy: Secondary | ICD-10-CM | POA: Diagnosis not present

## 2023-08-16 DIAGNOSIS — Z87891 Personal history of nicotine dependence: Secondary | ICD-10-CM | POA: Diagnosis not present

## 2023-08-16 DIAGNOSIS — I2724 Chronic thromboembolic pulmonary hypertension: Secondary | ICD-10-CM | POA: Diagnosis not present

## 2023-08-16 DIAGNOSIS — R2240 Localized swelling, mass and lump, unspecified lower limb: Secondary | ICD-10-CM | POA: Diagnosis not present

## 2023-08-16 NOTE — ED Triage Notes (Signed)
Pt POV reporting R leg swelling after stopping prednisone last week. Took prednisone for 3 months for sciatica, tapered as ordered per pt. Also reports 3 episodes of emesis last night which resolved after taking nausea meds

## 2023-08-17 ENCOUNTER — Emergency Department (HOSPITAL_BASED_OUTPATIENT_CLINIC_OR_DEPARTMENT_OTHER): Payer: Medicare Other

## 2023-08-17 ENCOUNTER — Ambulatory Visit (HOSPITAL_BASED_OUTPATIENT_CLINIC_OR_DEPARTMENT_OTHER)
Admission: RE | Admit: 2023-08-17 | Discharge: 2023-08-17 | Disposition: A | Discharge status: Home / Self Care | Payer: Medicare Other | Source: Ambulatory Visit | Attending: Emergency Medicine | Admitting: Emergency Medicine

## 2023-08-17 ENCOUNTER — Other Ambulatory Visit: Payer: Self-pay

## 2023-08-17 ENCOUNTER — Encounter (HOSPITAL_BASED_OUTPATIENT_CLINIC_OR_DEPARTMENT_OTHER): Payer: Self-pay | Admitting: Emergency Medicine

## 2023-08-17 ENCOUNTER — Observation Stay (HOSPITAL_BASED_OUTPATIENT_CLINIC_OR_DEPARTMENT_OTHER)
Admission: EM | Admit: 2023-08-17 | Discharge: 2023-08-18 | Disposition: A | Discharge status: Home / Self Care | Payer: Medicare Other | Attending: Internal Medicine | Admitting: Internal Medicine

## 2023-08-17 ENCOUNTER — Other Ambulatory Visit (HOSPITAL_BASED_OUTPATIENT_CLINIC_OR_DEPARTMENT_OTHER): Payer: Self-pay | Admitting: Emergency Medicine

## 2023-08-17 DIAGNOSIS — G7249 Other inflammatory and immune myopathies, not elsewhere classified: Secondary | ICD-10-CM | POA: Insufficient documentation

## 2023-08-17 DIAGNOSIS — Z87891 Personal history of nicotine dependence: Secondary | ICD-10-CM | POA: Insufficient documentation

## 2023-08-17 DIAGNOSIS — I824Y1 Acute embolism and thrombosis of unspecified deep veins of right proximal lower extremity: Secondary | ICD-10-CM | POA: Diagnosis not present

## 2023-08-17 DIAGNOSIS — Z79899 Other long term (current) drug therapy: Secondary | ICD-10-CM | POA: Insufficient documentation

## 2023-08-17 DIAGNOSIS — I2724 Chronic thromboembolic pulmonary hypertension: Secondary | ICD-10-CM | POA: Insufficient documentation

## 2023-08-17 DIAGNOSIS — R600 Localized edema: Secondary | ICD-10-CM | POA: Insufficient documentation

## 2023-08-17 DIAGNOSIS — M7989 Other specified soft tissue disorders: Secondary | ICD-10-CM | POA: Insufficient documentation

## 2023-08-17 DIAGNOSIS — G4733 Obstructive sleep apnea (adult) (pediatric): Secondary | ICD-10-CM | POA: Insufficient documentation

## 2023-08-17 DIAGNOSIS — K449 Diaphragmatic hernia without obstruction or gangrene: Secondary | ICD-10-CM | POA: Diagnosis not present

## 2023-08-17 DIAGNOSIS — M79604 Pain in right leg: Secondary | ICD-10-CM | POA: Diagnosis not present

## 2023-08-17 DIAGNOSIS — I10 Essential (primary) hypertension: Secondary | ICD-10-CM | POA: Diagnosis not present

## 2023-08-17 DIAGNOSIS — I82401 Acute embolism and thrombosis of unspecified deep veins of right lower extremity: Secondary | ICD-10-CM | POA: Diagnosis not present

## 2023-08-17 DIAGNOSIS — F32A Depression, unspecified: Secondary | ICD-10-CM | POA: Diagnosis not present

## 2023-08-17 DIAGNOSIS — I82409 Acute embolism and thrombosis of unspecified deep veins of unspecified lower extremity: Secondary | ICD-10-CM

## 2023-08-17 DIAGNOSIS — Z85828 Personal history of other malignant neoplasm of skin: Secondary | ICD-10-CM | POA: Insufficient documentation

## 2023-08-17 DIAGNOSIS — Z955 Presence of coronary angioplasty implant and graft: Secondary | ICD-10-CM | POA: Diagnosis not present

## 2023-08-17 DIAGNOSIS — I2510 Atherosclerotic heart disease of native coronary artery without angina pectoris: Secondary | ICD-10-CM | POA: Insufficient documentation

## 2023-08-17 DIAGNOSIS — R7989 Other specified abnormal findings of blood chemistry: Secondary | ICD-10-CM | POA: Diagnosis present

## 2023-08-17 DIAGNOSIS — Z0389 Encounter for observation for other suspected diseases and conditions ruled out: Secondary | ICD-10-CM | POA: Diagnosis not present

## 2023-08-17 DIAGNOSIS — Z7982 Long term (current) use of aspirin: Secondary | ICD-10-CM | POA: Insufficient documentation

## 2023-08-17 DIAGNOSIS — I82411 Acute embolism and thrombosis of right femoral vein: Secondary | ICD-10-CM | POA: Diagnosis not present

## 2023-08-17 DIAGNOSIS — Z951 Presence of aortocoronary bypass graft: Secondary | ICD-10-CM | POA: Insufficient documentation

## 2023-08-17 DIAGNOSIS — I700 Atherosclerosis of aorta: Secondary | ICD-10-CM | POA: Diagnosis not present

## 2023-08-17 DIAGNOSIS — I749 Embolism and thrombosis of unspecified artery: Secondary | ICD-10-CM

## 2023-08-17 LAB — COMPREHENSIVE METABOLIC PANEL
ALT: 31 U/L (ref 0–44)
AST: 25 U/L (ref 15–41)
Albumin: 4 g/dL (ref 3.5–5.0)
Alkaline Phosphatase: 26 U/L — ABNORMAL LOW (ref 38–126)
Anion gap: 8 (ref 5–15)
BUN: 20 mg/dL (ref 8–23)
CO2: 28 mmol/L (ref 22–32)
Calcium: 8.7 mg/dL — ABNORMAL LOW (ref 8.9–10.3)
Chloride: 103 mmol/L (ref 98–111)
Creatinine, Ser: 0.8 mg/dL (ref 0.61–1.24)
GFR, Estimated: >60 mL/min (ref >60)
Glucose, Bld: 117 mg/dL — ABNORMAL HIGH (ref 70–99)
Potassium: 4.1 mmol/L (ref 3.5–5.1)
Sodium: 139 mmol/L (ref 135–145)
Total Bilirubin: 0.7 mg/dL (ref 0.3–1.2)
Total Protein: 6.6 g/dL (ref 6.5–8.1)

## 2023-08-17 LAB — CBC WITH DIFFERENTIAL/PLATELET
Abs Immature Granulocytes: 0.07 10*3/uL (ref 0.00–0.07)
Basophils Absolute: 0 10*3/uL (ref 0.0–0.1)
Basophils Relative: 0 %
Eosinophils Absolute: 0 10*3/uL (ref 0.0–0.5)
Eosinophils Relative: 0 %
HCT: 35 % — ABNORMAL LOW (ref 39.0–52.0)
Hemoglobin: 11.4 g/dL — ABNORMAL LOW (ref 13.0–17.0)
Immature Granulocytes: 2 %
Lymphocytes Relative: 16 %
Lymphs Abs: 0.7 10*3/uL (ref 0.7–4.0)
MCH: 27 pg (ref 26.0–34.0)
MCHC: 32.6 g/dL (ref 30.0–36.0)
MCV: 82.7 fL (ref 80.0–100.0)
Monocytes Absolute: 0.3 10*3/uL (ref 0.1–1.0)
Monocytes Relative: 6 %
Neutro Abs: 3.5 10*3/uL (ref 1.7–7.7)
Neutrophils Relative %: 76 %
Platelets: 113 10*3/uL — ABNORMAL LOW (ref 150–400)
RBC: 4.23 MIL/uL (ref 4.22–5.81)
RDW: 15.6 % — ABNORMAL HIGH (ref 11.5–15.5)
WBC: 4.6 10*3/uL (ref 4.0–10.5)
nRBC: 0 % (ref 0.0–0.2)

## 2023-08-17 LAB — TROPONIN I (HIGH SENSITIVITY)
Troponin I (High Sensitivity): 90 ng/L — ABNORMAL HIGH (ref <18)
Troponin I (High Sensitivity): 90 ng/L — ABNORMAL HIGH (ref <18)

## 2023-08-17 LAB — D-DIMER, QUANTITATIVE: D-Dimer, Quant: 2.06 ug/mL-FEU — ABNORMAL HIGH (ref 0.00–0.50)

## 2023-08-17 MED ORDER — ROSUVASTATIN CALCIUM 20 MG PO TABS
20.0000 mg | ORAL_TABLET | Freq: Every day | ORAL | Status: DC
  Administered 2023-08-18: 20 mg via ORAL
  Filled 2023-08-17: qty 1

## 2023-08-17 MED ORDER — ASPIRIN 81 MG PO CHEW
81.0000 mg | CHEWABLE_TABLET | Freq: Every day | ORAL | Status: DC
  Administered 2023-08-18: 81 mg via ORAL
  Filled 2023-08-17: qty 1

## 2023-08-17 MED ORDER — RIVAROXABAN 15 MG PO TABS
15.0000 mg | ORAL_TABLET | Freq: Once | ORAL | Status: AC
  Administered 2023-08-17: 15 mg via ORAL
  Filled 2023-08-17: qty 1

## 2023-08-17 MED ORDER — IOHEXOL 350 MG/ML SOLN
80.0000 mL | Freq: Once | INTRAVENOUS | Status: AC | PRN
  Administered 2023-08-17: 80 mL via INTRAVENOUS

## 2023-08-17 MED ORDER — SERTRALINE HCL 50 MG PO TABS
50.0000 mg | ORAL_TABLET | Freq: Every day | ORAL | Status: DC
  Administered 2023-08-18: 50 mg via ORAL
  Filled 2023-08-17: qty 1

## 2023-08-17 MED ORDER — PANTOPRAZOLE SODIUM 40 MG PO TBEC
40.0000 mg | DELAYED_RELEASE_TABLET | Freq: Every day | ORAL | Status: DC
  Administered 2023-08-18: 40 mg via ORAL
  Filled 2023-08-17: qty 1

## 2023-08-17 MED ORDER — FINASTERIDE 5 MG PO TABS
5.0000 mg | ORAL_TABLET | Freq: Every day | ORAL | Status: DC
  Administered 2023-08-18: 5 mg via ORAL
  Filled 2023-08-17: qty 1

## 2023-08-17 MED ORDER — METOPROLOL SUCCINATE ER 25 MG PO TB24: 25.0000 mg | ORAL_TABLET | Freq: Every day | ORAL | Status: DC

## 2023-08-17 MED ORDER — ZOLPIDEM TARTRATE 5 MG PO TABS
5.0000 mg | ORAL_TABLET | Freq: Every day | ORAL | Status: DC
  Administered 2023-08-17: 5 mg via ORAL
  Filled 2023-08-17: qty 1

## 2023-08-17 MED ORDER — HEPARIN BOLUS VIA INFUSION
4000.0000 [IU] | Freq: Once | INTRAVENOUS | Status: AC
  Administered 2023-08-17: 4000 [IU] via INTRAVENOUS

## 2023-08-17 MED ORDER — HEPARIN (PORCINE) 25000 UT/250ML-% IV SOLN
1200.0000 [IU]/h | INTRAVENOUS | Status: DC
  Administered 2023-08-17: 1200 [IU]/h via INTRAVENOUS
  Filled 2023-08-17: qty 250

## 2023-08-17 NOTE — ED Provider Notes (Signed)
[place] EMERGENCY DEPARTMENT AT [facility] Provider Note   CSN: [id number] Arrival date & time: 08/17/23  1509     History  Chief Complaint  Patient presents with   DVT    Timothy Gates is a 77 y.o. male history of CAD with h/o stents, dyslipidemia, GERD, melanoma presented for positive DVT study.  Patient came in last night concern for right lower leg swelling calf tenderness and was given Lovenox and instructed to come back to ER for ultrasound.  Ultrasound came back positive for DVT that does appear to be extensive.  Patient's daughter is present to also assist in history.  Daughter states patient has been short of breath for the past week with wheezing.  Patient does have history of melanoma which daughter suspects contributed to blood clot.  Patient and daughter deny fevers, hemoptysis, previous blood clots, events of weakness  Cardiologist: Thurmon Fair, MD   Home Medications Prior to Admission medications   Medication Sig Start Date End Date Taking? Authorizing Provider  acetaminophen (TYLENOL) 325 MG tablet Take 650 mg by mouth every 6 (six) hours as needed for moderate pain. Patient not taking: Reported on 06/04/2023    [provider]  Ascorbic Acid (VITAMIN C PO) Take 1 tablet by mouth daily.    [provider]  aspirin 81 MG chewable tablet Chew 81 mg by mouth daily.    [provider]  benzonatate (TESSALON) 100 MG capsule Take 100 mg by mouth 3 (three) times daily as needed. Patient not taking: Reported on 06/04/2023 12/27/22   [provider]  Cholecalciferol (VITAMIN D3) 1000 units CAPS Take 1,000 Units by mouth daily.    [provider]  finasteride (PROSCAR) 5 MG tablet Take 1 tablet by mouth daily.    [provider]  MAGNESIUM PO Take 1 tablet by mouth daily. Patient not taking: Reported on 06/04/2023    [provider]  metoprolol succinate (TOPROL-XL) 25 MG 24 hr tablet Take 25 mg by  mouth daily after supper. Take with or immediately following a meal.    [provider]  pantoprazole (PROTONIX) 40 MG tablet Take 40 mg by mouth daily.    [provider]  rosuvastatin (CRESTOR) 20 MG tablet TAKE 1 TABLET BY MOUTH EVERY DAY Patient taking differently: Take 20 mg by mouth daily. Patient stated he take 1/2 tablet daily 11/27/21   Croitoru, Mihai, MD  sertraline (ZOLOFT) 50 MG tablet Take 50 mg by mouth daily.    [provider]  vitamin B-12 (CYANOCOBALAMIN) 1000 MCG tablet Take 1,000 mcg by mouth daily.    [provider]  zolpidem (AMBIEN) 10 MG tablet Take 10 mg by mouth at bedtime.    [provider]      Allergies    Benadryl [diphenhydramine hcl], Meloxicam, and Tramadol    Review of Systems   Review of Systems  Physical Exam Updated Vital Signs BP (!) 173/71   Pulse 91   Temp 98.6 F (37 C) (Oral)   Resp 18   SpO2 97%  Physical Exam Vitals reviewed.  Constitutional:      General: He is not in acute distress. HENT:     Head: Normocephalic and atraumatic.  Eyes:     Extraocular Movements: Extraocular movements intact.     Conjunctiva/sclera: Conjunctivae normal.     Pupils: Pupils are equal, round, and reactive to light.  Cardiovascular:     Rate and Rhythm: Normal rate and regular rhythm.  Pulses: Normal pulses.     Heart sounds: Normal heart sounds.     Comments: 2+ bilateral radial/dorsalis pedis pulses with regular rate Pulmonary:     Effort: Pulmonary effort is normal. No respiratory distress.     Breath sounds: Normal breath sounds.  Abdominal:     Palpations: Abdomen is soft.     Tenderness: There is no abdominal tenderness. There is no guarding or rebound.  Musculoskeletal:        General: Normal range of motion.     Cervical back: Normal range of motion and neck supple.     Right lower leg: Edema (Right leg edema that is nonpitting, calf tenderness) present.     Comments: 5 out of 5 bilateral  grip/leg extension strength  Skin:    General: Skin is warm and dry.     Capillary Refill: Capillary refill takes less than 2 seconds.  Neurological:     General: No focal deficit present.     Mental Status: He is alert and oriented to person, place, and time.     Comments: Sensation intact in all 4 limbs  Psychiatric:        Mood and Affect: Mood normal.     ED Results / Procedures / Treatments   Labs (all labs ordered are listed, but only abnormal results are displayed) Labs Reviewed  CBC WITH DIFFERENTIAL/PLATELET - Abnormal; Notable for the following components:      Result Value   Hemoglobin 11.4 (*)    HCT 35.0 (*)    RDW 15.6 (*)    Platelets 113 (*)    All other components within normal limits  COMPREHENSIVE METABOLIC PANEL - Abnormal; Notable for the following components:   Glucose, Bld 117 (*)    Calcium 8.7 (*)    Alkaline Phosphatase 26 (*)    All other components within normal limits  D-DIMER, QUANTITATIVE - Abnormal; Notable for the following components:   D-Dimer, Quant 2.06 (*)    All other components within normal limits  TROPONIN I (HIGH SENSITIVITY) - Abnormal; Notable for the following components:   Troponin I (High Sensitivity) 90 (*)    All other components within normal limits  HEPARIN LEVEL (UNFRACTIONATED)  TROPONIN I (HIGH SENSITIVITY)    EKG None  Radiology CT Angio Chest PE W and/or Wo Contrast  Result Date: 08/17/2023 CLINICAL DATA:  Pulmonary embolism (PE) suspected, high prob EXAM: CT ANGIOGRAPHY CHEST WITH CONTRAST TECHNIQUE: Multidetector CT imaging of the chest was performed using the standard protocol during bolus administration of intravenous contrast. Multiplanar CT image reconstructions and MIPs were obtained to evaluate the vascular anatomy. RADIATION DOSE REDUCTION: This exam was performed according to the departmental dose-optimization program which includes automated exposure control, adjustment of the mA and/or kV according to  patient size and/or use of iterative reconstruction technique. CONTRAST:  80mL OMNIPAQUE IOHEXOL 350 MG/ML SOLN COMPARISON:  Chest x-ray 04/13/2012 FINDINGS: Cardiovascular: Satisfactory opacification of the pulmonary arteries. Small web-like nonocclusive filling defect within the lobar branch pulmonary artery of the right lower lobe (series 5, image 153; series 7, image 72). Additional linear nonocclusive filling defect within a segmental branch of the right upper lobe (series 5, image 123; series 7, image 69). No occlusive pulmonary emboli. Normal heart size dad is post CABG. Thoracic aorta is nonaneurysmal. Scattered atherosclerotic vascular calcifications of the aorta and coronary arteries. Mediastinum/Nodes: No enlarged mediastinal, hilar, or axillary lymph nodes. Thyroid gland, trachea, and esophagus demonstrate no significant findings. Tiny hiatal hernia. Lungs/Pleura:  Lungs are clear. No pleural effusion or pneumothorax. Upper Abdomen: No acute abnormality. Musculoskeletal: No chest wall abnormality. No acute or significant osseous findings. Review of the MIP images confirms the above findings. IMPRESSION: 1. Small web-like nonocclusive filling defects within the lobar branch pulmonary artery of the right lower lobe and a segmental branch of the right upper lobe. Appearance most compatible with chronic thromboemboli. No evidence of acute for occlusive pulmonary embolism. 2. Lungs are clear. 3. Tiny hiatal hernia. 4. Aortic and coronary artery atherosclerosis (ICD10-I70.0). Electronically Signed   By: Duanne Guess D.O.   On: 08/17/2023 16:23   US Venous Img Lower Unilateral Right (DVT)  Result Date: 08/17/2023 CLINICAL DATA:  Pain and swelling x5 days EXAM: RIGHT LOWER EXTREMITY VENOUS DOPPLER ULTRASOUND TECHNIQUE: Gray-scale sonography with compression, as well as color and duplex ultrasound, were performed to evaluate the deep venous system(s) from the level of the common femoral vein through the  popliteal and proximal calf veins. COMPARISON:  None Available. FINDINGS: VENOUS Hypoechoic incompletely compressible thrombus throughout the right common femoral vein, femoral vein, popliteal vein, and visualized calf veins. There are scattered areas of continued flow signal in the femoral vein. Deep femoral vein remains patent. Limited views of the contralateral common femoral vein are unremarkable. OTHER None. Limitations: none IMPRESSION: POSITIVE for extensive partially occlusive right femoropopliteal and calf DVT. Marland Kitchen Electronically Signed   By: Corlis Leak M.D.   On: 08/17/2023 14:19    Procedures .Critical Care  Performed by: Netta Corrigan, PA-C Authorized by: Netta Corrigan, PA-C   Critical care provider statement:    Critical care time (minutes):  30   Critical care time was exclusive of:  Separately billable procedures and treating other patients   Critical care was necessary to treat or prevent imminent or life-threatening deterioration of the following conditions: DVT, chronic thromboemboli.   Critical care was time spent personally by me on the following activities:  Blood draw for specimens, development of treatment plan with patient or surrogate, discussions with consultants, evaluation of patient's response to treatment, examination of patient, obtaining history from patient or surrogate, review of old charts, re-evaluation of patient's condition, pulse oximetry, ordering and review of radiographic studies, ordering and review of laboratory studies and ordering and performing treatments and interventions   I assumed direction of critical care for this patient from another provider in my specialty: no     Care discussed with: admitting provider       Medications Ordered in ED Medications  heparin bolus via infusion 4,000 Units (has no administration in time range)  heparin ADULT infusion 100 units/mL (25000 units/265mL) (has no administration in time range)  iohexol (OMNIPAQUE)  350 MG/ML injection 80 mL (80 mLs Intravenous Contrast Given 08/17/23 1556)    ED Course/ Medical Decision Making/ A&P             HEART Score: 7                    Medical Decision Making  Lissa Hoard 77 y.o. presented today for right limb edema. Working DDx that I considered at this time includes, but not limited to, dependent edema, venous insufficiency, DVT, arterial emboli/thrombus, phlegmasia alba/cerulea dolens, neurovascular compromise, limb ischemia, cellulitis, abscess, sepsis, PE.  R/o DDx: DVT, arterial emboli/thrombus, phlegmasia alba/cerulea dolens, neurovascular compromise, limb ischemia, cellulitis, abscess, sepsis: These are considered less likely due to history of present illness and physical exam findings  Review of prior external notes:  08/16/2023 ED  Unique Tests and My Interpretation:  CBC: Unremarkable CMP: Unremarkable D-dimer: Elevated 2.06 Troponin: 90, 90 CTA chest: EKG: Sinus 87 bpm, incomplete right bundle branch block that has been seen previously, no ST elevations or depressions noted  Discussion with Independent Historian:  Daughter  Discussion of Management of Tests:  Donnamarie Rossetti, MD Vascular; Artis Flock, MD Hospitalist  Risk: High: hospitalization or escalation of hospital-level care  Risk Stratification Score: none  Staffed with [place], DO  Plan: On exam patient was no acute distress of hypertensive 173/71 which I suspect is chronic.  Patient's return DVT study was positive for an extensive DVT and the vascular specialist was consulted and states that patient does not have a PE he can be discharged on Eliquis and follow-up with the DVT clinic.  Daughter states that patient has been having shortness of breath and wheezing over the past week with the leg swelling and so labs and CTA were ordered which do show chronic thromboemboli however patient's troponin is 90.  Patient does have cardiac history however unable to find previous troponins to  compare to and so will admit for cardiac rule out and start heparin to help with the DVT and chronic thromboemboli.  This plan is verbalized to the patient patient verbalized understanding and acceptance of this.  Hospitalist consulted.  I talked to the hospitalist and they agree with admission.  This is patient's second troponin is elevated then cardiology will need to be consulted if patient still present in the ED.  Repeat troponin is flat.  Do not need to consult cardiology at this time.  Patient stable for admission.        Final Clinical Impression(s) / ED Diagnoses Final diagnoses:  Acute deep vein thrombosis (DVT) of proximal vein of right lower extremity (HCC)  Chronic thromboembolic disease [facility])    Rx / DC Orders ED Discharge Orders     None         Remi Deter 08/17/23 1801    Virgina Norfolk, DO 08/17/23 2307

## 2023-08-17 NOTE — Progress Notes (Signed)
ANTICOAGULATION CONSULT NOTE - Initial Consult  Pharmacy Consult for heparin Indication: DVT  Allergies  Allergen Reactions   Benadryl [Diphenhydramine Hcl] Other (See Comments)    Severe Headache   Meloxicam Itching   Tramadol Itching    Patient Measurements:   Heparin Dosing Weight: TBW  Vital Signs: Temp: 98.6 F (37 C) (09/22 1516) Temp Source: Oral (09/22 1516) BP: 173/71 (09/22 1516) Pulse Rate: 91 (09/22 1516)  Labs: Recent Labs    08/17/23 1546  HGB 11.4*  HCT 35.0*  PLT 113*  CREATININE 0.80  TROPONINIHS 90*    Estimated Creatinine Clearance: 69.8 mL/min (by C-G formula based on SCr of 0.8 mg/dL).   Medical History: Past Medical History:  Diagnosis Date   Anxiety    Arthritis    lower back   Balance problem    CAD (coronary artery disease)    Cancer (HCC)    melanoma   Depression    GERD (gastroesophageal reflux disease)    Hyperlipidemia    Hypertension    Iron deficiency anemia    Low back pain    OSA (obstructive sleep apnea)    Pneumonia    RBBB (right bundle branch block) 10/28/2019   Noted on EKG   Tinnitus    Left ear    Assessment: [name] presenting with leg swelling, positive for DVT, CT angio chest shows non-occlusive chronic thromboemboli.  He is not on anticoagulation PTA, chronic anemia stable, plts 113  Goal of Therapy:  Heparin level 0.3-0.7 units/ml Monitor platelets by anticoagulation protocol: Yes   Plan:  Heparin 4000 units IV x 1, and gtt at 1200 units/hr F/u 8 hour heparin level F/u long term [facility] plan  Daylene Posey, PharmD, [facility] Clinical Pharmacist ED Pharmacist Phone # [phone] 08/17/2023 4:42 PM

## 2023-08-17 NOTE — H&P (Signed)
History and Physical    Patient: Timothy Gates HQI:[id number] DOB: 11-03-1946 DOA: 08/17/2023 DOS: the patient was seen and examined on 08/17/2023 PCP: Merri Brunette, MD  Patient coming from:  [facility]   Chief Complaint:  Chief Complaint  Patient presents with   DVT   HPI: Timothy Gates is a 77 y.o. male with medical history significant of CAD s/p CABG, OSA, HLD, GERD who presents with  right leg swelling.   Had right LE swelling for about a week. For the past several months has also had right LE pain from his right buttock down. Has been weak and not able to stand up from sitting position. He was given steroids for sciatic pain from before swelling started. No other recent prolonged travel. No recent immobilization or surgery. No shortness of breath or chest pain. No weight loss. No new GI symptoms.   In the ED, he was afebrile, tachycardic to 108, tacypneic and hypertensive to 170/90 on room air.   CBC with no leukocytosis, mild anemia with hgb of 11.4. BMP with mild hypocalcemia of 8.7.  CTA chest with small web-like nonocclusive filling defect within lobar branch pulmonary artery of the right lower lobe.  Additional linear nonocclusive filling defect within a segmental branch of the right upper lobe.  No occlusive pulmonary emboli.  Appearance compatible with chronic thromboemboli. Venous Doppler ultrasound of the right lower extremity revealing for extensive partially occlusive right femoral-popliteal and calf DVT.  ED PA consulted vascular surgery and it was documented that they recommend patient be sent home on anticoagulation.  However patient was noted to have elevated troponin of 90 and given his cardiac history hospitalist was admitted for further workup.  Patient was started on IV heparin for his DVT and transferred here to [facility].  Review of Systems: As mentioned in the history of present illness. All other systems reviewed and are negative. Past Medical  History:  Diagnosis Date   Anxiety    Arthritis    lower back   Balance problem    CAD (coronary artery disease)    Cancer (HCC)    melanoma   Depression    GERD (gastroesophageal reflux disease)    Hyperlipidemia    Hypertension    Iron deficiency anemia    Low back pain    OSA (obstructive sleep apnea)    Pneumonia    RBBB (right bundle branch block) 10/28/2019   Noted on EKG   Tinnitus    Left ear   Past Surgical History:  Procedure Laterality Date   CORONARY ARTERY BYPASS GRAFT  04/02/1999   double   DG BARIUM SWALLOW ([facility] HX)     FISSURECTOMY  2006   MUSCLE BIOPSY Left 06/21/2022   Procedure: LEFT THIGH MUSCLE BIOPSY;  Surgeon: Fritzi Mandes, MD;  Location: [facility];  Service: General;  Laterality: Left;   SHOULDER ARTHROSCOPY WITH ROTATOR CUFF REPAIR AND SUBACROMIAL DECOMPRESSION Left 03/22/2020   Procedure: RIGHT SHOULDER MINI OPEN ROTATOR CUFF REPAIR AND SUBACROMIAL DECOMPRESSION;  Surgeon: Jene Every, MD;  Location: [facility];  Service: Orthopedics;  Laterality: Left;  90 mins   SIGMOIDOSCOPY     SKIN CANCER EXCISION     UPPER GI ENDOSCOPY     Social History:  reports that he quit smoking about 31 years ago. His smoking use included cigarettes. He quit smokeless tobacco use about 30 years ago. He reports that he does not drink alcohol and does not use drugs.  Allergies  Allergen  Reactions   Benadryl [Diphenhydramine Hcl] Other (See Comments)    Severe Headache   Meloxicam Itching   Tramadol Itching    Family History  Problem Relation Age of Onset   Diabetes Mother    Cancer Father    Cancer Sister    Cancer Sister    Cancer Brother    Cancer Maternal Grandmother    Cancer Maternal Grandfather    Heart attack Paternal Grandmother    Heart attack Paternal Grandfather     Prior to Admission medications   Medication Sig Start Date End Date Taking? Authorizing Provider  acetaminophen (TYLENOL) 325 MG tablet Take 650 mg by mouth every 6 (six) hours as  needed for moderate pain. Patient not taking: Reported on 06/04/2023    [provider]  Ascorbic Acid (VITAMIN C PO) Take 1 tablet by mouth daily.    [provider]  aspirin 81 MG chewable tablet Chew 81 mg by mouth daily.    [provider]  benzonatate (TESSALON) 100 MG capsule Take 100 mg by mouth 3 (three) times daily as needed. Patient not taking: Reported on 06/04/2023 12/27/22   [provider]  Cholecalciferol (VITAMIN D3) 1000 units CAPS Take 1,000 Units by mouth daily.    [provider]  finasteride (PROSCAR) 5 MG tablet Take 1 tablet by mouth daily.    [provider]  MAGNESIUM PO Take 1 tablet by mouth daily. Patient not taking: Reported on 06/04/2023    [provider]  metoprolol succinate (TOPROL-XL) 25 MG 24 hr tablet Take 25 mg by mouth daily after supper. Take with or immediately following a meal.    [provider]  pantoprazole (PROTONIX) 40 MG tablet Take 40 mg by mouth daily.    [provider]  rosuvastatin (CRESTOR) 20 MG tablet TAKE 1 TABLET BY MOUTH EVERY DAY Patient taking differently: Take 20 mg by mouth daily. Patient stated he take 1/2 tablet daily 11/27/21   Croitoru, Mihai, MD  sertraline (ZOLOFT) 50 MG tablet Take 50 mg by mouth daily.    [provider]  vitamin B-12 (CYANOCOBALAMIN) 1000 MCG tablet Take 1,000 mcg by mouth daily.    [provider]  zolpidem (AMBIEN) 10 MG tablet Take 10 mg by mouth at bedtime.    [provider]    Physical Exam: Vitals:   08/17/23 1700 08/17/23 1730 08/17/23 1835 08/17/23 1837  BP: (!) 170/97 (!) 179/79  (!) 175/70  Pulse: (!) 108 91  (!) 101  Resp: (!) 22 (!) 25  20  Temp:   98.8 F (37.1 C) 98.8 F (37.1 C)  TempSrc:   Oral Oral  SpO2: 96% 98%  96%  Weight:    74.2 kg  Height:    5\' 6"  (1.676 m)   Constitutional: NAD, calm, comfortable, well appearing elderly male laying flat in bed Eyes:  lids and  conjunctivae normal ENMT: Mucous membranes are moist.  Neck: normal, supple Respiratory: clear to auscultation bilaterally, no wheezing, no crackles. Normal respiratory effort. No accessory muscle use.  Cardiovascular: Regular rate and rhythm, no murmurs / rubs / gallops. Non-pitting edema of the right LE around ankle Abdomen: soft, non-tender, no mass palpated  Musculoskeletal: no clubbing / cyanosis. No joint deformity upper and lower extremities. Normal muscle tone.  Skin: no rashes, lesions, ulcers. No induration Neurologic: CN 2-12 grossly intact. Sensation intact,  Strength 5/5 in all 4.  Psychiatric: Normal judgment and insight. Alert and oriented x 3. Normal mood.  Data Reviewed:  See HPI  Assessment and Plan: * Elevated troponin -hx of CABG. Currently asymptomatic and no recent dyspnea or chest pain.  -Last echocardiogram in 09/2021 with EF of 60 to 65%.  Mild concentric LVH.  Diastolic parameters indeterminate.  Mild mitral valve regurgitation. -troponin could be elevated from chronic pulmonary thromboemboli  -obtain new echocardiogram to evaluate  HTN (hypertension) -elevated. Chronic thromboemboli could be a factor.  -continue home beta-blocker  Depression -continue Zoloft and Ambien  DVT (deep venous thrombosis) (HCC) Chronic pulmonary thromboemboli -Venous Doppler ultrasound of the right lower extremity revealing for extensive partially occlusive right femoral-popliteal and calf DVT. 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 He is being referred to physician in [facility] for further evaluation and treatment.  -Has difficulty with going from sitting to standing positive. Right leg feels weak and has difficulty ambulating.  -will have PT evaluate  OSA (obstructive sleep apnea) -non-compliant  with CPAP  Coronary artery disease involving native coronary artery of native heart without angina pectoris Continue aspirin, statin      Advance Care Planning: Full  Consults: ED PA curbsided vascular surgery   Family Communication: spoke with wife over the phone  Severity of Illness: The appropriate patient status for this patient is OBSERVATION. Observation status is judged to be reasonable and necessary in order to provide the required intensity of service to ensure the patient's safety. The patient's presenting symptoms, physical exam findings, and initial radiographic and laboratory data in the context of their medical condition is felt to place them at decreased risk for further clinical deterioration. Furthermore, it is anticipated that the patient will be medically stable for discharge from the hospital within 2 midnights of admission.   Author: Anselm Jungling, DO 08/17/2023 9:14 PM  For on call review [email or web address].

## 2023-08-17 NOTE — ED Provider Notes (Signed)
[place] EMERGENC[facility] DEPARTMENT AT [facility] Provider Note   CSN: [id number] Arrival date & time: 08/16/23  2302     History  Chief Complaint  Patient presents with   Leg Swelling    Timothy Gates is a 77 y.o. male.  Patient is a 77 year old male with history of hyperlipidemia, OSA, sciatica.  Patient presenting with complaints of right leg swelling.  This has been ongoing for the past 2 weeks since his doctor took him off of prednisone he was taking for sciatica.  No chest pain, shortness of breath, or fever.  No aggravating or alleviating factors.    The history is provided by the patient.       Home Medications Prior to Admission medications   Medication Sig Start Date End Date Taking? Authorizing Provider  acetaminophen (TYLENOL) 325 MG tablet Take 650 mg by mouth every 6 (six) hours as needed for moderate pain. Patient not taking: Reported on 06/04/2023    [provider]  Ascorbic Acid (VITAMIN C PO) Take 1 tablet by mouth daily.    [provider]  aspirin 81 MG chewable tablet Chew 81 mg by mouth daily.    [provider]  benzonatate (TESSALON) 100 MG capsule Take 100 mg by mouth 3 (three) times daily as needed. Patient not taking: Reported on 06/04/2023 12/27/22   [provider]  Cholecalciferol (VITAMIN D3) 1000 units CAPS Take 1,000 Units by mouth daily.    [provider]  finasteride (PROSCAR) 5 MG tablet Take 1 tablet by mouth daily.    [provider]  MAGNESIUM PO Take 1 tablet by mouth daily. Patient not taking: Reported on 06/04/2023    [provider]  metoprolol succinate (TOPROL-XL) 25 MG 24 hr tablet Take 25 mg by mouth daily after supper. Take with or immediately following a meal.    [provider]  pantoprazole (PROTONIX) 40 MG tablet Take 40 mg by mouth daily.    [provider]  rosuvastatin (CRESTOR) 20 MG tablet TAKE 1 TABLET BY MOUTH EVERY DAY Patient  taking differently: Take 20 mg by mouth daily. Patient stated he take 1/2 tablet daily 11/27/21   Croitoru, Mihai, MD  sertraline (ZOLOFT) 50 MG tablet Take 50 mg by mouth daily.    [provider]  vitamin B-12 (CYANOCOBALAMIN) 1000 MCG tablet Take 1,000 mcg by mouth daily.    [provider]  zolpidem (AMBIEN) 10 MG tablet Take 10 mg by mouth at bedtime.    [provider]      Allergies    Benadryl [diphenhydramine hcl], Meloxicam, and Tramadol    Review of Systems   Review of Systems  All other systems reviewed and are negative.   Physical Exam Updated Vital Signs BP 137/74   Pulse 73   Temp 98.5 F (36.9 C) (Oral)   Resp 19   Ht 5\' 6"  (1.676 m)   Wt 74.4 kg   SpO2 98%   BMI 26.47 kg/m  Physical Exam Vitals and nursing note reviewed.  Constitutional:      Appearance: Normal appearance.  Pulmonary:     Effort: Pulmonary effort is normal.  Musculoskeletal:     Comments: The right lower extremity does have 1+ pitting edema in a stocking distribution.  DP pulses are palpable and motor and sensation are intact to the entire foot.  Homans sign is absent.    Skin:    General: Skin is warm and dry.  Neurological:  Mental Status: He is alert.     ED Results / Procedures / Treatments   Labs (all labs ordered are listed, but only abnormal results are displayed) Labs Reviewed - No data to display  EKG None  Radiology No results found.  Procedures Procedures    Medications Ordered in ED Medications - No data to display  ED Course/ Medical Decision Making/ A&P  Patient presenting with right leg swelling as described in the HPI.  He arrives with stable vital signs and is afebrile.  No hypoxia.  Exam does reveal 1+ pitting edema, but negative Homans sign.  I do have concern for DVT, however [place] is not here tonight.  I will give one dose of xarelto and have him return in the morning for [place].  No signs or symptoms of PE.  Final Clinical  Impression(s) / ED Diagnoses Final diagnoses:  None    Rx / DC Orders ED Discharge Orders     None         Geoffery Lyons, MD 08/17/23 [phone]

## 2023-08-17 NOTE — ED Triage Notes (Signed)
Pt positive for DVT RLE, needs further testing

## 2023-08-17 NOTE — Assessment & Plan Note (Addendum)
-  hx of CABG. Currently asymptomatic and no recent dyspnea or chest pain.  -Last echocardiogram in 09/2021 with EF of 60 to 65%.  Mild concentric LVH.  Diastolic parameters indeterminate.  Mild mitral valve regurgitation. 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

## 2023-08-17 NOTE — ED Notes (Signed)
Attempted to call [facility] for report x3. [facility] at bedside.

## 2023-08-17 NOTE — Assessment & Plan Note (Signed)
-  elevated. Chronic thromboemboli could be a factor.  -continue home beta-blocker

## 2023-08-17 NOTE — Assessment & Plan Note (Addendum)
-  pt has had year long elevated CK. Had muscle biopsy with inclusion body myositis. He is being referred to physician in [facility] for further evaluation and treatment.  -Has difficulty with going from sitting to standing positive. Right leg feels weak and has difficulty ambulating.  -will have PT evaluate

## 2023-08-17 NOTE — Progress Notes (Signed)
Plan of Care Note for accepted transfer   Patient: Timothy Gates MRN: [id number]   DOA: 08/17/2023  Facility requesting transfer: [facility] Requesting Provider: Dr. Lockie Mola  Reason for transfer:  Facility course: 77 year old male with history of CAD with hx of bypass, HLD, OSA on cpap, inflammatory myopathy, melanoma who presented to ED with complaints of +DVT and some shortness of breath.   Vitals: stable Pertinent labs: troponin: 90>pending d.dimer: 2.06 DVT: POSITIVE for extensive partially occlusive right femoropopliteal and calf DVT. CTA chest:  Small web-like nonocclusive filling defects within the lobar branch pulmonary artery of the right lower lobe and a segmental branch of the right upper lobe. Appearance most compatible with chronic thromboemboli. No evidence of acute for occlusive pulmonary embolism.  EKG: NSR rate of 87. Incomplete RBBB.   In ED: stared on heparin gtt. Vascular surgery consulted. [facility] asked to admit. Vascular recommended he be sent home on eliquis. They are concerned b/c of elevated troponin. He has no chest pain or acute changes on EKG. Delta troponin not resulted. Asked to call cardiology if delta troponin increases significantly.   Plan of care: The patient is accepted for admission to Telemetry unit, at [facility]..    Author: Orland Mustard, MD 08/17/2023  Check [email or web address] for on-call coverage.  Nursing staff, Please call [facility] Admits & Consults System-Wide number on Amion as soon as patient's arrival, so appropriate admitting provider can evaluate the pt.

## 2023-08-17 NOTE — Assessment & Plan Note (Signed)
-  non-compliant with CPAP

## 2023-08-17 NOTE — ED Notes (Signed)
First contact with patient. Pt arrived via triage from home + DSVT RLE.  Pt. denies shob, is A&OX 4, resp. even/unlabored. Pt placed into gown and on cardiac monitor, call light within reach. Patient updated on plan of care. Will continue to monitor patient.

## 2023-08-17 NOTE — Discharge Instructions (Signed)
Return tomorrow at the given time for an ultrasound of your leg to rule out a blood clot.

## 2023-08-17 NOTE — Assessment & Plan Note (Signed)
-  continue Zoloft and Ambien

## 2023-08-17 NOTE — Assessment & Plan Note (Signed)
Continue aspirin, statin.  

## 2023-08-17 NOTE — Assessment & Plan Note (Signed)
Chronic pulmonary thromboemboli -Venous Doppler ultrasound of the right lower extremity revealing for extensive partially occlusive right femoral-popliteal and calf DVT. -possibly provoked by recent steroid use -on IV heparin and can transition to oral anticoagulation in the morning

## 2023-08-17 NOTE — ED Notes (Signed)
[facility] sending transport for [facility] Bed Ready 6E RM#26.-ABB(NS)

## 2023-08-18 ENCOUNTER — Encounter (HOSPITAL_COMMUNITY): Payer: Self-pay | Admitting: Family Medicine

## 2023-08-18 ENCOUNTER — Other Ambulatory Visit (HOSPITAL_COMMUNITY): Payer: Self-pay

## 2023-08-18 ENCOUNTER — Observation Stay (HOSPITAL_BASED_OUTPATIENT_CLINIC_OR_DEPARTMENT_OTHER): Payer: Medicare Other

## 2023-08-18 DIAGNOSIS — R079 Chest pain, unspecified: Secondary | ICD-10-CM | POA: Diagnosis not present

## 2023-08-18 DIAGNOSIS — R7989 Other specified abnormal findings of blood chemistry: Secondary | ICD-10-CM | POA: Diagnosis not present

## 2023-08-18 DIAGNOSIS — I824Y1 Acute embolism and thrombosis of unspecified deep veins of right proximal lower extremity: Secondary | ICD-10-CM | POA: Diagnosis not present

## 2023-08-18 LAB — ECHOCARDIOGRAM COMPLETE
AR max vel: 3.4 cm2
AV Area VTI: 3.31 cm2
AV Area mean vel: 3.4 cm2
AV Mean grad: 5 mmHg
AV Peak grad: 9.6 mmHg
Ao pk vel: 1.55 m/s
Area-P 1/2: 4.6 cm2
Calc EF: 74.2 %
Height: 66 in
MV M vel: 5.81 m/s
MV Peak grad: 135 mmHg
Radius: 0.3 cm
S' Lateral: 2.6 cm
Single Plane A2C EF: 78.6 %
Single Plane A4C EF: 66.7 %
Weight: 2617.6 oz

## 2023-08-18 LAB — CBC
HCT: 33.1 % — ABNORMAL LOW (ref 39.0–52.0)
Hemoglobin: 10.5 g/dL — ABNORMAL LOW (ref 13.0–17.0)
MCH: 26.2 pg (ref 26.0–34.0)
MCHC: 31.7 g/dL (ref 30.0–36.0)
MCV: 82.5 fL (ref 80.0–100.0)
Platelets: 110 10*3/uL — ABNORMAL LOW (ref 150–400)
RBC: 4.01 MIL/uL — ABNORMAL LOW (ref 4.22–5.81)
RDW: 15.3 % (ref 11.5–15.5)
WBC: 4.1 10*3/uL (ref 4.0–10.5)
nRBC: 0 % (ref 0.0–0.2)

## 2023-08-18 LAB — HEPARIN LEVEL (UNFRACTIONATED): Heparin Unfractionated: >1.1 IU/mL — ABNORMAL HIGH (ref 0.30–0.70)

## 2023-08-18 MED ORDER — APIXABAN 5 MG PO TABS: 5.0000 mg | ORAL_TABLET | Freq: Two times a day (BID) | ORAL | Status: DC

## 2023-08-18 MED ORDER — HEPARIN (PORCINE) 25000 UT/250ML-% IV SOLN: 1000.0000 [IU]/h | INTRAVENOUS | Status: DC

## 2023-08-18 MED ORDER — METOPROLOL SUCCINATE ER 25 MG PO TB24: 25.0000 mg | ORAL_TABLET | Freq: Every day | ORAL | 0 refills | Status: AC

## 2023-08-18 MED ORDER — APIXABAN 5 MG PO TABS
10.0000 mg | ORAL_TABLET | Freq: Two times a day (BID) | ORAL | Status: DC
  Administered 2023-08-18: 10 mg via ORAL
  Filled 2023-08-18: qty 2

## 2023-08-18 MED ORDER — ORAL CARE MOUTH RINSE: 15.0000 mL | OROMUCOSAL | Status: DC | PRN

## 2023-08-18 MED ORDER — INFLUENZA VAC A&B SURF ANT ADJ 0.5 ML IM SUSY: 0.5000 mL | PREFILLED_SYRINGE | INTRAMUSCULAR | Status: DC

## 2023-08-18 MED ORDER — APIXABAN (ELIQUIS) VTE STARTER PACK (10MG AND 5MG): ORAL_TABLET | ORAL | 0 refills | Status: DC

## 2023-08-18 NOTE — Progress Notes (Signed)
Mobility Specialist Progress Note:   08/18/23 1300  Mobility  Activity Ambulated with assistance in hallway  Level of Assistance Standby assist, set-up cues, supervision of patient - no hands on  Assistive Device Front wheel walker  Distance Ambulated (ft) 940 ft  Activity Response Tolerated well  Mobility Referral Yes  $Mobility charge 1 Mobility  Mobility Specialist Start Time (ACUTE ONLY) 1200  Mobility Specialist Stop Time (ACUTE ONLY) 1217  Mobility Specialist Time Calculation (min) (ACUTE ONLY) 17 min    Pre Mobility: 97 HR During Mobility: 120 HR Post Mobility:  105 HR  Received pt in bed having no complaints and agreeable to mobility. Pt was asymptomatic throughout ambulation and returned to room w/o fault. Left in bed w/ call bell in reach and all needs met.   D'Vante Earlene Plater Mobility Specialist Please contact via Special educational needs teacher or Rehab office at [phone]

## 2023-08-18 NOTE — TOC Benefit Eligibility Note (Signed)
Patient Product/process development scientist completed.    The patient is insured through [employer]. Patient has Medicare and is not eligible for a copay card, but may be able to apply for patient assistance, if available.    Ran test claim for Eliquis 5 mg and the current 30 day co-pay is $541.90 due to a deductible.   This test claim was processed through [facility]- copay amounts may vary at other pharmacies due to pharmacy/plan contracts, or as the patient moves through the different stages of their insurance plan.     Roland Earl, CPHT Pharmacy Technician III Certified Patient Advocate [facility] Pharm[facility] Patient Advocate Team Direct Number: [phone]  Fax: [phone]

## 2023-08-18 NOTE — Progress Notes (Signed)
ANTICOAGULATION CONSULT NOTE - Initial Consult  Pharmacy Consult for heparin > Eliquis Indication: DVT  Allergies  Allergen Reactions   Benadryl [Diphenhydramine Hcl] Other (See Comments)    Severe Headache   Meloxicam Itching   Tramadol Itching    Patient Measurements: Height: 5\' 6"  (167.6 cm) Weight: 74.2 kg (163 lb 9.6 oz) IBW/kg (Calculated) : 63.8 Heparin Dosing Weight: TBW  Vital Signs: Temp: 98.8 F (37.1 C) (09/23 0425) Temp Source: Oral (09/23 0425) BP: 159/83 (09/23 0425) Pulse Rate: 91 (09/23 0425)  Labs: Recent Labs    08/17/23 1546 08/17/23 1727 08/18/23 0042 08/18/23 0043  HGB 11.4*  --   --  10.5*  HCT 35.0*  --   --  33.1*  PLT 113*  --   --  110*  HEPARINUNFRC  --   --  >1.10*  --   CREATININE 0.80  --   --   --   TROPONINIHS 90* 90*  --   --     Estimated Creatinine Clearance: 69.8 mL/min (by C-G formula based on SCr of 0.8 mg/dL).   Medical History: Past Medical History:  Diagnosis Date   Anxiety    Arthritis    lower back   Balance problem    CAD (coronary artery disease)    Cancer (HCC)    melanoma   Depression    GERD (gastroesophageal reflux disease)    Hyperlipidemia    Hypertension    Iron deficiency anemia    Low back pain    OSA (obstructive sleep apnea)    Pneumonia    RBBB (right bundle branch block) 10/28/2019   Noted on EKG   Tinnitus    Left ear    Assessment: [name] presenting with leg swelling, positive for DVT, CT angio chest shows non-occlusive chronic thromboemboli.  He is not on anticoagulation PTA, chronic anemia stable, plts 113  HL >1.10 - supratherapeutic RN confirmed w patient that lab was drawn on opposite of heparin infusion No s/sx bleeding perRN  Goal of Therapy:  Heparin level 0.3-0.7 units/ml Monitor platelets by anticoagulation protocol: Yes   Plan:  Stop heparin infusion for 1 hour then Decrease rate of heparin infusion to 1000 units/hr F/u 8 hour heparin level F/u long term AC  plan  9/23 AM Update: Consulted to transition to Eliquis.  Stop heparin infusion and start Eliquis 10 mg twice daily x7 days then 5 mg twice daily thereafter.  Trixie Rude, PharmD Clinical Pharmacist 08/18/2023  7:26 AM

## 2023-08-18 NOTE — Evaluation (Signed)
Physical Therapy Evaluation Patient Details Name: Timothy Gates MRN: [id number] DOB: 10-06-1946 Today's Date: 08/18/2023  History of Present Illness  Pt is a 77 y/o M admitted on 08/17/23 after presenting with c/o RLE swelling x 1 week. Pt found to have chronic pulmonary thromboemboli, & Venous Doppler ultrasound of the RLE revealing for extensive partially occlusive right femoral-popliteal & calf DVT. PMH: CAD s/p CABG, OSA, HLD, GERD, inflammatory myopathy  Clinical Impression  Pt seen for PT evaluation with pt agreeable, up in room ambulating to bathroom without AD. Pt reports prior to admission he ambulated primarily with RW, unable to recall if he has fallen in the past 6 months or not.  PT provided pt with RW & pt ambulates into hallway with supervision with impaired gait pattern as noted below, although pt reports this is close to his baseline. Will continue to follow pt acutely to address gait pattern, strengthening, endurance, & stair negotiation.      If plan is discharge home, recommend the following: Assistance with cooking/housework;Assist for transportation;Help with stairs or ramp for entrance   Can travel by private vehicle        Equipment Recommendations None recommended by PT  Recommendations for Other Services       Functional Status Assessment Patient has had a recent decline in their functional status and demonstrates the ability to make significant improvements in function in a reasonable and predictable amount of time.     Precautions / Restrictions Precautions Precautions: Fall Restrictions Weight Bearing Restrictions: No      Mobility  Bed Mobility               General bed mobility comments: not tested, pt received up in room, left sitting in recliner    Transfers Overall transfer level: Modified independent Equipment used: None               General transfer comment: STS from recliner without assistance     Ambulation/Gait Ambulation/Gait assistance: Supervision Gait Distance (Feet): 10 Feet (+ 50 ft) Assistive device: Rolling walker (2 wheels), None Gait Pattern/deviations: Decreased stride length, Decreased dorsiflexion - right, Decreased dorsiflexion - left, Decreased step length - left, Decreased step length - right Gait velocity: decreased     General Gait Details: Pt ambulates in room/bathroom without AD with supervision then into hallway with RW & supervision. Pt ambulates with very short step length BLE, decreased dorsiflexion & heel strike, decreased hip/knee flexion BLE during swing phase. PT provides education/cuing re: need for increased heel strike & pt attempts to return demo. PT educated pt on ability to roll RW vs picking it up to advance it.  Stairs            Wheelchair Mobility     Tilt Bed    Modified Rankin (Stroke Patients Only)       Balance Overall balance assessment: Needs assistance Sitting-balance support: Feet supported Sitting balance-Leahy Scale: Good     Standing balance support: During functional activity, No upper extremity supported Standing balance-Leahy Scale: Fair                               Pertinent Vitals/Pain Pain Assessment Pain Assessment: No/denies pain    Home Living Family/patient expects to be discharged to:: Private residence Living Arrangements: Spouse/significant other Available Help at Discharge: Family Type of Home: House Home Access: Stairs to enter Entrance Stairs-Rails: Left Entrance Stairs-Number of Steps: 5-6  Home Layout: One level Home Equipment: Agricultural consultant (2 wheels);Electric scooter;Cane - single point      Prior Function Prior Level of Function : Independent/Modified Independent             Mobility Comments: Pt reports he ambulates primarily with RW in the home (occasionally will ambulate without AD), uses electric scooter outside, unable to recall if he's had any falls in  the past 6 months, states "I'm not sure"       Extremity/Trunk Assessment   Upper Extremity Assessment Upper Extremity Assessment: Overall WFL for tasks assessed    Lower Extremity Assessment Lower Extremity Assessment: Generalized weakness       Communication   Communication Communication: No apparent difficulties  Cognition Arousal: Alert Behavior During Therapy: WFL for tasks assessed/performed Overall Cognitive Status: Within Functional Limits for tasks assessed                                 General Comments: question memory as pt unable to recall if he's had any falls in the past 6 months or not, no family present during session        General Comments      Exercises     Assessment/Plan    PT Assessment Patient needs continued PT services  PT Problem List Decreased strength;Decreased knowledge of use of DME;Decreased balance       PT Treatment Interventions DME instruction;Balance training;Gait training;Neuromuscular re-education;Stair training;Functional mobility training;Patient/family education;Therapeutic activities;Therapeutic exercise;Manual techniques    PT Goals (Current goals can be found in the Care Plan section)  Acute Rehab PT Goals Patient Stated Goal: get better, go home PT Goal Formulation: With patient Time For Goal Achievement: 09/01/23 Potential to Achieve Goals: Good    Frequency Min 1X/week     Co-evaluation               AM-PAC PT "6 Clicks" Mobility  Outcome Measure Help needed turning from your back to your side while in a flat bed without using bedrails?: None Help needed moving from lying on your back to sitting on the side of a flat bed without using bedrails?: None Help needed moving to and from a bed to a chair (including a wheelchair)?: A Little Help needed standing up from a chair using your arms (e.g., wheelchair or bedside chair)?: None Help needed to walk in hospital room?: A Little Help needed  climbing 3-5 steps with a railing? : A Little 6 Click Score: 21    End of Session   Activity Tolerance: Patient tolerated treatment well Patient left: in chair;with call bell/phone within reach   PT Visit Diagnosis: Unsteadiness on feet (R26.81);Other abnormalities of gait and mobility (R26.89);Muscle weakness (generalized) (M62.81)    Time: 1610-9604 PT Time Calculation (min) (ACUTE ONLY): 16 min   Charges:   PT Evaluation $PT Eval Low Complexity: 1 Low   PT General Charges $$ ACUTE PT VISIT: 1 Visit         Aleda Grana, PT, DPT 08/18/23, 10:25 AM   Sandi Mariscal 08/18/2023, 10:24 AM

## 2023-08-18 NOTE — Progress Notes (Addendum)
ANTICOAGULATION CONSULT NOTE - Initial Consult  Pharmacy Consult for heparin Indication: DVT  Allergies  Allergen Reactions   Benadryl [Diphenhydramine Hcl] Other (See Comments)    Severe Headache   Meloxicam Itching   Tramadol Itching    Patient Measurements: Height: 5\' 6"  (167.6 cm) Weight: 74.2 kg (163 lb 9.6 oz) IBW/kg (Calculated) : 63.8 Heparin Dosing Weight: TBW  Vital Signs: Temp: 97.9 F (36.6 C) (09/22 2342) Temp Source: Oral (09/22 2342) BP: 163/77 (09/22 2342) Pulse Rate: 100 (09/22 2342)  Labs: Recent Labs    08/17/23 1546 08/17/23 1727 08/18/23 0042 08/18/23 0043  HGB 11.4*  --   --  10.5*  HCT 35.0*  --   --  33.1*  PLT 113*  --   --  110*  HEPARINUNFRC  --   --  >1.10*  --   CREATININE 0.80  --   --   --   TROPONINIHS 90* 90*  --   --     Estimated Creatinine Clearance: 69.8 mL/min (by C-G formula based on SCr of 0.8 mg/dL).   Medical History: Past Medical History:  Diagnosis Date   Anxiety    Arthritis    lower back   Balance problem    CAD (coronary artery disease)    Cancer (HCC)    melanoma   Depression    GERD (gastroesophageal reflux disease)    Hyperlipidemia    Hypertension    Iron deficiency anemia    Low back pain    OSA (obstructive sleep apnea)    Pneumonia    RBBB (right bundle branch block) 10/28/2019   Noted on EKG   Tinnitus    Left ear    Assessment: [name] presenting with leg swelling, positive for DVT, CT angio chest shows non-occlusive chronic thromboemboli.  He is not on anticoagulation PTA, chronic anemia stable, plts 113  HL >1.10 - supratherapeutic RN confirmed w patient that lab was drawn on opposite of heparin infusion No s/sx bleeding perRN  Goal of Therapy:  Heparin level 0.3-0.7 units/ml Monitor platelets by anticoagulation protocol: Yes   Plan:  Stop heparin infusion for 1 hour then Decrease rate of heparin infusion to 1000 units/hr F/u 8 hour heparin level F/u long term AC  plan  Calton Dach, PharmD, BCCCP Clinical Pharmacist 08/18/2023 1:29 AM

## 2023-08-18 NOTE — Progress Notes (Signed)
  Echocardiogram 2D Echocardiogram has been performed.  Timothy Gates 08/18/2023, 10:49 AM

## 2023-08-18 NOTE — Progress Notes (Signed)
Report called to  [facility] spoke to [place] LPN @1145 

## 2023-08-18 NOTE — Discharge Summary (Signed)
Physician Discharge Summary  Timothy Gates ZOX:[id number] DOB: 1946/06/04 DOA: 08/17/2023  PCP: Merri Brunette, MD  Admit date: 08/17/2023 Discharge date: 08/18/2023  Admitted From: Home Disposition: Home  Recommendations for Outpatient Follow-up:  Follow up with PCP in 1-2 weeks Follow-up with cardiology as scheduled  Home Health: None Equipment/Devices: None  Discharge Condition: Stable CODE STATUS: Full Diet recommendation: Low-salt low-fat low-carb diet  Brief/Interim Summary: Timothy Gates is a 77 y.o. male with medical history significant of CAD s/p CABG, OSA, HLD, GERD who presents with  right leg swelling.  During hospitalization patient was noted to have right lower extremity partially occlusive femoral-popliteal and calf DVT, questionably in setting of recent steroid use.  Patient also has notable chronic pulmonary thromboemboli per imaging, echo with no heart strain.  Patient otherwise remains asymptomatic, minimally elevated troponin in the setting of above but flat.  Transition from IV heparin to p.o. Eliquis and he is otherwise stable and agreeable for discharge.  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  Discharge Diagnoses:  Principal Problem:   Elevated troponin Active Problems:   Coronary artery disease involving native coronary artery of native heart without angina pectoris   OSA (obstructive sleep apnea)   Inflammatory myopathy   DVT (deep venous thrombosis) (HCC)   Depression   HTN (hypertension)    Discharge Instructions   Allergies as of 08/18/2023       Reactions   Pravastatin Other (See Comments)   Muscle weakness   Benadryl [diphenhydramine Hcl] Other (See Comments)   Severe Headache   Meloxicam Itching   Tramadol Itching        Medication List     STOP taking these medications     ibuprofen 200 MG tablet Commonly known as: ADVIL   pravastatin 40 MG tablet Commonly known as: PRAVACHOL   predniSONE 20 MG tablet Commonly known as: DELTASONE       TAKE these medications    Apixaban Starter Pack (10mg  and 5mg ) Commonly known as: ELIQUIS STARTER PACK Take as directed on package: start with two-5mg  tablets twice daily for 7 days. On day 8, switch to one-5mg  tablet twice daily.   aspirin 81 MG chewable tablet Chew 81 mg by mouth daily.   finasteride 5 MG tablet Commonly known as: PROSCAR Take 1 tablet by mouth daily.   metoprolol succinate 25 MG 24 hr tablet Commonly known as: TOPROL-XL Take 1 tablet (25 mg total) by mouth daily after supper. What changed:  how much to take additional instructions   pantoprazole 40 MG tablet Commonly known as: PROTONIX Take 40 mg by mouth daily.   rosuvastatin 20 MG tablet Commonly known as: CRESTOR TAKE 1 TABLET BY MOUTH EVERY DAY   sertraline 50 MG tablet Commonly known as: ZOLOFT Take 50 mg by mouth daily.   VITAMIN C PO Take 1 tablet by mouth daily.   Vitamin D3 25 MCG (1000 UT) Caps Take 1,000 Units by mouth daily.   zolpidem 10 MG tablet Commonly known as: AMBIEN Take 10 mg by mouth at bedtime.        Allergies  Allergen Reactions   Pravastatin Other (See Comments)    Muscle weakness   Benadryl [Diphenhydramine Hcl] Other (See Comments)    Severe Headache   Meloxicam Itching   Tramadol Itching    Consultations: None  Procedures/Studies: ECHOCARDIOGRAM COMPLETE  Result Date:  08/18/2023    ECHOCARDIOGRAM REPORT   Patient Name:   Timothy Gates [facility] Date of Exam: 08/18/2023 Medical Rec #:  [id number]         Height:       66.0 in Accession #:    [id number]        Weight:       163.6 lb Date of Birth:  [birth year]        BSA:          1.836 m Patient Age:    76 years          BP:           159/83 mmHg Patient Gender: M                 HR:           92 bpm. Exam Location:  Inpatient Procedure:  2D Echo, Cardiac Doppler and Color Doppler Indications:    R07.9* Chest pain, unspecified. Elevated troponin.  History:        Patient has prior history of Echocardiogram examinations, most                 recent 10/01/2021. CAD, Prior CABG, Signs/Symptoms:Edema; Risk                 Factors:Sleep Apnea, Hypertension and Dyslipidemia.  Sonographer:    Sheralyn Boatman RDCS Referring Phys: [id number] CHING T TU IMPRESSIONS  1. Left ventricular ejection fraction, by estimation, is 65 to 70%. The left ventricle has normal function. The left ventricle has no regional wall motion abnormalities. There is mild concentric left ventricular hypertrophy. Left ventricular diastolic parameters were normal.  2. Right ventricular systolic function is low normal. The right ventricular size is normal.  3. The mitral valve is abnormal. Mild mitral valve regurgitation. No evidence of mitral stenosis. Moderate mitral annular calcification.  4. The aortic valve is tricuspid. There is mild calcification of the aortic valve. Aortic valve regurgitation is not visualized. No aortic stenosis is present.  5. The inferior vena cava is normal in size with greater than 50% respiratory variability, suggesting right atrial pressure of 3 mmHg. Comparison(s): No significant change from prior study. FINDINGS  Left Ventricle: Left ventricular ejection fraction, by estimation, is 65 to 70%. The left ventricle has normal function. The left ventricle has no regional wall motion abnormalities. The left ventricular internal cavity size was normal in size. There is  mild concentric left ventricular hypertrophy. Left ventricular diastolic parameters were normal. Right Ventricle: The right ventricular size is normal. Right vetricular wall thickness was not well visualized. Right ventricular systolic function is low normal. Left Atrium: Left atrial size was normal in size. Right Atrium: Right atrial size was normal in size. Pericardium: There is no evidence of  pericardial effusion. Mitral Valve: Calcification of anterior leaflet, with chordal calcification noted below anterior leaflet. The mitral valve is abnormal. Moderate mitral annular calcification. Mild mitral valve regurgitation. No evidence of mitral valve stenosis. Tricuspid Valve: The tricuspid valve is normal in structure. Tricuspid valve regurgitation is trivial. No evidence of tricuspid stenosis. Aortic Valve: The aortic valve is tricuspid. There is mild calcification of the aortic valve. Aortic valve regurgitation is not visualized. No aortic stenosis is present. Aortic valve mean gradient measures 5.0 mmHg. Aortic valve peak gradient measures 9.6 mmHg. Aortic valve area, by VTI measures 3.31 cm. Pulmonic Valve: The pulmonic valve was grossly normal. Pulmonic valve regurgitation is not visualized. No evidence of pulmonic stenosis. Aorta:  Physician Discharge Summary  Timothy Gates ZOX:[id number] DOB: 1946/06/04 DOA: 08/17/2023  PCP: Merri Brunette, MD  Admit date: 08/17/2023 Discharge date: 08/18/2023  Admitted From: Home Disposition: Home  Recommendations for Outpatient Follow-up:  Follow up with PCP in 1-2 weeks Follow-up with cardiology as scheduled  Home Health: None Equipment/Devices: None  Discharge Condition: Stable CODE STATUS: Full Diet recommendation: Low-salt low-fat low-carb diet  Brief/Interim Summary: Timothy Gates is a 77 y.o. male with medical history significant of CAD s/p CABG, OSA, HLD, GERD who presents with  right leg swelling.  During hospitalization patient was noted to have right lower extremity partially occlusive femoral-popliteal and calf DVT, questionably in setting of recent steroid use.  Patient also has notable chronic pulmonary thromboemboli per imaging, echo with no heart strain.  Patient otherwise remains asymptomatic, minimally elevated troponin in the setting of above but flat.  Transition from IV heparin to p.o. Eliquis and he is otherwise stable and agreeable for discharge.  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  Discharge Diagnoses:  Principal Problem:   Elevated troponin Active Problems:   Coronary artery disease involving native coronary artery of native heart without angina pectoris   OSA (obstructive sleep apnea)   Inflammatory myopathy   DVT (deep venous thrombosis) (HCC)   Depression   HTN (hypertension)    Discharge Instructions   Allergies as of 08/18/2023       Reactions   Pravastatin Other (See Comments)   Muscle weakness   Benadryl [diphenhydramine Hcl] Other (See Comments)   Severe Headache   Meloxicam Itching   Tramadol Itching        Medication List     STOP taking these medications     ibuprofen 200 MG tablet Commonly known as: ADVIL   pravastatin 40 MG tablet Commonly known as: PRAVACHOL   predniSONE 20 MG tablet Commonly known as: DELTASONE       TAKE these medications    Apixaban Starter Pack (10mg  and 5mg ) Commonly known as: ELIQUIS STARTER PACK Take as directed on package: start with two-5mg  tablets twice daily for 7 days. On day 8, switch to one-5mg  tablet twice daily.   aspirin 81 MG chewable tablet Chew 81 mg by mouth daily.   finasteride 5 MG tablet Commonly known as: PROSCAR Take 1 tablet by mouth daily.   metoprolol succinate 25 MG 24 hr tablet Commonly known as: TOPROL-XL Take 1 tablet (25 mg total) by mouth daily after supper. What changed:  how much to take additional instructions   pantoprazole 40 MG tablet Commonly known as: PROTONIX Take 40 mg by mouth daily.   rosuvastatin 20 MG tablet Commonly known as: CRESTOR TAKE 1 TABLET BY MOUTH EVERY DAY   sertraline 50 MG tablet Commonly known as: ZOLOFT Take 50 mg by mouth daily.   VITAMIN C PO Take 1 tablet by mouth daily.   Vitamin D3 25 MCG (1000 UT) Caps Take 1,000 Units by mouth daily.   zolpidem 10 MG tablet Commonly known as: AMBIEN Take 10 mg by mouth at bedtime.        Allergies  Allergen Reactions   Pravastatin Other (See Comments)    Muscle weakness   Benadryl [Diphenhydramine Hcl] Other (See Comments)    Severe Headache   Meloxicam Itching   Tramadol Itching    Consultations: None  Procedures/Studies: ECHOCARDIOGRAM COMPLETE  Result Date:  08/18/2023    ECHOCARDIOGRAM REPORT   Patient Name:   Timothy Gates [facility] Date of Exam: 08/18/2023 Medical Rec #:  [id number]         Height:       66.0 in Accession #:    [id number]        Weight:       163.6 lb Date of Birth:  [birth year]        BSA:          1.836 m Patient Age:    76 years          BP:           159/83 mmHg Patient Gender: M                 HR:           92 bpm. Exam Location:  Inpatient Procedure:  2D Echo, Cardiac Doppler and Color Doppler Indications:    R07.9* Chest pain, unspecified. Elevated troponin.  History:        Patient has prior history of Echocardiogram examinations, most                 recent 10/01/2021. CAD, Prior CABG, Signs/Symptoms:Edema; Risk                 Factors:Sleep Apnea, Hypertension and Dyslipidemia.  Sonographer:    Sheralyn Boatman RDCS Referring Phys: [id number] CHING T TU IMPRESSIONS  1. Left ventricular ejection fraction, by estimation, is 65 to 70%. The left ventricle has normal function. The left ventricle has no regional wall motion abnormalities. There is mild concentric left ventricular hypertrophy. Left ventricular diastolic parameters were normal.  2. Right ventricular systolic function is low normal. The right ventricular size is normal.  3. The mitral valve is abnormal. Mild mitral valve regurgitation. No evidence of mitral stenosis. Moderate mitral annular calcification.  4. The aortic valve is tricuspid. There is mild calcification of the aortic valve. Aortic valve regurgitation is not visualized. No aortic stenosis is present.  5. The inferior vena cava is normal in size with greater than 50% respiratory variability, suggesting right atrial pressure of 3 mmHg. Comparison(s): No significant change from prior study. FINDINGS  Left Ventricle: Left ventricular ejection fraction, by estimation, is 65 to 70%. The left ventricle has normal function. The left ventricle has no regional wall motion abnormalities. The left ventricular internal cavity size was normal in size. There is  mild concentric left ventricular hypertrophy. Left ventricular diastolic parameters were normal. Right Ventricle: The right ventricular size is normal. Right vetricular wall thickness was not well visualized. Right ventricular systolic function is low normal. Left Atrium: Left atrial size was normal in size. Right Atrium: Right atrial size was normal in size. Pericardium: There is no evidence of  pericardial effusion. Mitral Valve: Calcification of anterior leaflet, with chordal calcification noted below anterior leaflet. The mitral valve is abnormal. Moderate mitral annular calcification. Mild mitral valve regurgitation. No evidence of mitral valve stenosis. Tricuspid Valve: The tricuspid valve is normal in structure. Tricuspid valve regurgitation is trivial. No evidence of tricuspid stenosis. Aortic Valve: The aortic valve is tricuspid. There is mild calcification of the aortic valve. Aortic valve regurgitation is not visualized. No aortic stenosis is present. Aortic valve mean gradient measures 5.0 mmHg. Aortic valve peak gradient measures 9.6 mmHg. Aortic valve area, by VTI measures 3.31 cm. Pulmonic Valve: The pulmonic valve was grossly normal. Pulmonic valve regurgitation is not visualized. No evidence of pulmonic stenosis. Aorta:  Physician Discharge Summary  Timothy Gates ZOX:[id number] DOB: 1946/06/04 DOA: 08/17/2023  PCP: Merri Brunette, MD  Admit date: 08/17/2023 Discharge date: 08/18/2023  Admitted From: Home Disposition: Home  Recommendations for Outpatient Follow-up:  Follow up with PCP in 1-2 weeks Follow-up with cardiology as scheduled  Home Health: None Equipment/Devices: None  Discharge Condition: Stable CODE STATUS: Full Diet recommendation: Low-salt low-fat low-carb diet  Brief/Interim Summary: Timothy Gates is a 77 y.o. male with medical history significant of CAD s/p CABG, OSA, HLD, GERD who presents with  right leg swelling.  During hospitalization patient was noted to have right lower extremity partially occlusive femoral-popliteal and calf DVT, questionably in setting of recent steroid use.  Patient also has notable chronic pulmonary thromboemboli per imaging, echo with no heart strain.  Patient otherwise remains asymptomatic, minimally elevated troponin in the setting of above but flat.  Transition from IV heparin to p.o. Eliquis and he is otherwise stable and agreeable for discharge.  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  Discharge Diagnoses:  Principal Problem:   Elevated troponin Active Problems:   Coronary artery disease involving native coronary artery of native heart without angina pectoris   OSA (obstructive sleep apnea)   Inflammatory myopathy   DVT (deep venous thrombosis) (HCC)   Depression   HTN (hypertension)    Discharge Instructions   Allergies as of 08/18/2023       Reactions   Pravastatin Other (See Comments)   Muscle weakness   Benadryl [diphenhydramine Hcl] Other (See Comments)   Severe Headache   Meloxicam Itching   Tramadol Itching        Medication List     STOP taking these medications     ibuprofen 200 MG tablet Commonly known as: ADVIL   pravastatin 40 MG tablet Commonly known as: PRAVACHOL   predniSONE 20 MG tablet Commonly known as: DELTASONE       TAKE these medications    Apixaban Starter Pack (10mg  and 5mg ) Commonly known as: ELIQUIS STARTER PACK Take as directed on package: start with two-5mg  tablets twice daily for 7 days. On day 8, switch to one-5mg  tablet twice daily.   aspirin 81 MG chewable tablet Chew 81 mg by mouth daily.   finasteride 5 MG tablet Commonly known as: PROSCAR Take 1 tablet by mouth daily.   metoprolol succinate 25 MG 24 hr tablet Commonly known as: TOPROL-XL Take 1 tablet (25 mg total) by mouth daily after supper. What changed:  how much to take additional instructions   pantoprazole 40 MG tablet Commonly known as: PROTONIX Take 40 mg by mouth daily.   rosuvastatin 20 MG tablet Commonly known as: CRESTOR TAKE 1 TABLET BY MOUTH EVERY DAY   sertraline 50 MG tablet Commonly known as: ZOLOFT Take 50 mg by mouth daily.   VITAMIN C PO Take 1 tablet by mouth daily.   Vitamin D3 25 MCG (1000 UT) Caps Take 1,000 Units by mouth daily.   zolpidem 10 MG tablet Commonly known as: AMBIEN Take 10 mg by mouth at bedtime.        Allergies  Allergen Reactions   Pravastatin Other (See Comments)    Muscle weakness   Benadryl [Diphenhydramine Hcl] Other (See Comments)    Severe Headache   Meloxicam Itching   Tramadol Itching    Consultations: None  Procedures/Studies: ECHOCARDIOGRAM COMPLETE  Result Date:

## 2023-08-19 ENCOUNTER — Telehealth: Payer: Self-pay | Admitting: Cardiovascular Disease

## 2023-08-19 MED ORDER — APIXABAN 5 MG PO TABS: ORAL_TABLET | ORAL | Status: AC

## 2023-08-19 NOTE — Telephone Encounter (Signed)
Pt c/o medication issue:  1. Name of Medication:   aspirin 81 MG chewable tablet   2. How are you currently taking this medication (dosage and times per day)? As prescribed  3. Are you having a reaction (difficulty breathing--STAT)?   4. What is your medication issue?   Patient stated he was recently in the hospital and he was supposed to be put on blood thinner.  Patient wants a call back to discuss blood thinner medication and if he should increase his aspirin.

## 2023-08-19 NOTE — Telephone Encounter (Signed)
Spoke to patient he stated he was discharged from [facility] yesterday.He has a DVT in rt calf.He was prescribed Eliquis.Stated he went to pick up prescription too expensive $ 560.He cannot afford.Advised I will leave Eliquis 5 mg (28 tablets) samples at [facility] office front desk.Advised he needs to take 5 mg 2 tablets twice a day for 7 days then on day 8 he takes 5 mg 1 tablet twice a day for a total of 74 tablets. He has appointment with PCP on Thurs 9/26 he will have her prescribe remaining tablets.I will make Dr.Croitoru aware.

## 2023-08-19 NOTE — Telephone Encounter (Signed)
We can make sure that he gets the 30 day card also. Is Xarelto or generic dabigatran a better choice for him?

## 2023-08-20 DIAGNOSIS — Z23 Encounter for immunization: Secondary | ICD-10-CM | POA: Diagnosis not present

## 2023-08-20 NOTE — Telephone Encounter (Signed)
He has a separate part D drug plan, which means he has an up-front deductible of about $600.  That's why it's so expensive.  Problem with that plan is that even if he pays it now, it will come up again on January 1.  I believe the Xarelto discount program is specifically for when the hit the coverage gap.   Dabigatran is probably the cheapest option.  If not covered on his insurance, it cost about $60-70/month at [facility] on [address].

## 2023-08-20 NOTE — Telephone Encounter (Signed)
Spoke to patient Dr.Croitoru's advice given.I will send telephone note to patient's PCP Lauretta Chester NP to make her aware.He has appointment with her tomorrow 9/26.

## 2023-08-22 DIAGNOSIS — Z09 Encounter for follow-up examination after completed treatment for conditions other than malignant neoplasm: Secondary | ICD-10-CM | POA: Diagnosis not present

## 2023-08-22 DIAGNOSIS — I2699 Other pulmonary embolism without acute cor pulmonale: Secondary | ICD-10-CM | POA: Diagnosis not present

## 2023-08-22 DIAGNOSIS — F322 Major depressive disorder, single episode, severe without psychotic features: Secondary | ICD-10-CM | POA: Diagnosis not present

## 2023-08-22 DIAGNOSIS — I82491 Acute embolism and thrombosis of other specified deep vein of right lower extremity: Secondary | ICD-10-CM | POA: Diagnosis not present

## 2023-08-29 DIAGNOSIS — I82491 Acute embolism and thrombosis of other specified deep vein of right lower extremity: Secondary | ICD-10-CM | POA: Diagnosis not present

## 2023-08-29 DIAGNOSIS — I2699 Other pulmonary embolism without acute cor pulmonale: Secondary | ICD-10-CM | POA: Diagnosis not present

## 2023-08-29 DIAGNOSIS — F322 Major depressive disorder, single episode, severe without psychotic features: Secondary | ICD-10-CM | POA: Diagnosis not present

## 2023-09-03 DIAGNOSIS — M25551 Pain in right hip: Secondary | ICD-10-CM | POA: Diagnosis not present

## 2023-09-17 DIAGNOSIS — F5101 Primary insomnia: Secondary | ICD-10-CM | POA: Diagnosis not present

## 2023-09-17 DIAGNOSIS — F419 Anxiety disorder, unspecified: Secondary | ICD-10-CM | POA: Diagnosis not present

## 2023-09-17 DIAGNOSIS — F331 Major depressive disorder, recurrent, moderate: Secondary | ICD-10-CM | POA: Diagnosis not present

## 2023-09-17 DIAGNOSIS — Z86711 Personal history of pulmonary embolism: Secondary | ICD-10-CM | POA: Diagnosis not present

## 2023-09-17 DIAGNOSIS — Z86718 Personal history of other venous thrombosis and embolism: Secondary | ICD-10-CM | POA: Diagnosis not present

## 2023-09-18 DIAGNOSIS — L600 Ingrowing nail: Secondary | ICD-10-CM | POA: Diagnosis not present

## 2023-09-18 DIAGNOSIS — M79672 Pain in left foot: Secondary | ICD-10-CM | POA: Diagnosis not present

## 2023-09-18 DIAGNOSIS — M79671 Pain in right foot: Secondary | ICD-10-CM | POA: Diagnosis not present

## 2023-10-02 DIAGNOSIS — F331 Major depressive disorder, recurrent, moderate: Secondary | ICD-10-CM | POA: Diagnosis not present

## 2023-10-02 DIAGNOSIS — F419 Anxiety disorder, unspecified: Secondary | ICD-10-CM | POA: Diagnosis not present

## 2023-10-02 DIAGNOSIS — L600 Ingrowing nail: Secondary | ICD-10-CM | POA: Diagnosis not present

## 2023-10-02 DIAGNOSIS — Z86711 Personal history of pulmonary embolism: Secondary | ICD-10-CM | POA: Diagnosis not present

## 2023-10-02 DIAGNOSIS — R2681 Unsteadiness on feet: Secondary | ICD-10-CM | POA: Diagnosis not present

## 2023-10-02 DIAGNOSIS — F5101 Primary insomnia: Secondary | ICD-10-CM | POA: Diagnosis not present

## 2023-10-02 DIAGNOSIS — Z86718 Personal history of other venous thrombosis and embolism: Secondary | ICD-10-CM | POA: Diagnosis not present

## 2023-10-10 DIAGNOSIS — R531 Weakness: Secondary | ICD-10-CM | POA: Diagnosis not present

## 2023-10-10 DIAGNOSIS — R2681 Unsteadiness on feet: Secondary | ICD-10-CM | POA: Diagnosis not present

## 2023-10-10 DIAGNOSIS — Z7409 Other reduced mobility: Secondary | ICD-10-CM | POA: Diagnosis not present

## 2023-10-10 DIAGNOSIS — R2689 Other abnormalities of gait and mobility: Secondary | ICD-10-CM | POA: Diagnosis not present

## 2023-10-16 DIAGNOSIS — Z7409 Other reduced mobility: Secondary | ICD-10-CM | POA: Diagnosis not present

## 2023-10-16 DIAGNOSIS — R2681 Unsteadiness on feet: Secondary | ICD-10-CM | POA: Diagnosis not present

## 2023-10-16 DIAGNOSIS — R2689 Other abnormalities of gait and mobility: Secondary | ICD-10-CM | POA: Diagnosis not present

## 2023-10-16 DIAGNOSIS — R531 Weakness: Secondary | ICD-10-CM | POA: Diagnosis not present

## 2023-10-21 ENCOUNTER — Encounter: Payer: Self-pay | Admitting: Cardiovascular Disease

## 2023-10-21 DIAGNOSIS — R7303 Prediabetes: Secondary | ICD-10-CM | POA: Diagnosis not present

## 2023-10-21 DIAGNOSIS — D509 Iron deficiency anemia, unspecified: Secondary | ICD-10-CM | POA: Diagnosis not present

## 2023-10-21 DIAGNOSIS — E785 Hyperlipidemia, unspecified: Secondary | ICD-10-CM | POA: Diagnosis not present

## 2023-10-21 LAB — LAB REPORT - SCANNED
A1c: 6.3
EGFR: 91

## 2023-10-21 NOTE — Telephone Encounter (Signed)
 Attempted to call patient, no answer left message requesting a call back.

## 2023-10-21 NOTE — Telephone Encounter (Signed)
Our chart shows he takes Eliquis

## 2023-10-21 NOTE — Telephone Encounter (Signed)
Patient calling the office for samples of medication:   1.  What medication and dosage are you requesting samples for? Xarelto 20 mg  2.  Are you currently out of this medication? Has only enough to last until Tuesday 12/3

## 2023-10-21 NOTE — Telephone Encounter (Signed)
Patient identification verified by 2 forms. Marilynn Rail, RN    Called and spoke to patient  Patient states:   -Initially received Xarelto samples from Cardiology office   -PCP started prescribing Xarelto 20mg  daily   -unable to request refill at pharmacy due to cost of $140   -would like additional samples him from cardiology  Informed patient message sent to for assistance  Patient verbalized understanding, no questions at this time

## 2023-10-27 NOTE — Telephone Encounter (Signed)
error 

## 2023-10-29 ENCOUNTER — Telehealth: Payer: Self-pay

## 2023-10-29 NOTE — Telephone Encounter (Signed)
Patient identification verified by 2 forms. Marilynn Rail, RN    Called and spoke to patient  Informed patient per Pharmacist message, no Xarelto samples available, follow up with PCP  Patient verbalized understanding, no questions at this time

## 2023-11-06 DIAGNOSIS — Z86711 Personal history of pulmonary embolism: Secondary | ICD-10-CM | POA: Diagnosis not present

## 2023-11-06 DIAGNOSIS — M25551 Pain in right hip: Secondary | ICD-10-CM | POA: Diagnosis not present

## 2023-11-06 DIAGNOSIS — M542 Cervicalgia: Secondary | ICD-10-CM | POA: Diagnosis not present

## 2023-11-06 DIAGNOSIS — M546 Pain in thoracic spine: Secondary | ICD-10-CM | POA: Diagnosis not present

## 2023-11-06 DIAGNOSIS — M545 Low back pain, unspecified: Secondary | ICD-10-CM | POA: Diagnosis not present

## 2023-11-06 DIAGNOSIS — I82409 Acute embolism and thrombosis of unspecified deep veins of unspecified lower extremity: Secondary | ICD-10-CM | POA: Diagnosis not present

## 2023-11-10 ENCOUNTER — Ambulatory Visit: Payer: Medicare Other | Attending: Cardiovascular Disease | Admitting: Cardiovascular Disease

## 2023-11-10 ENCOUNTER — Encounter: Payer: Self-pay | Admitting: Cardiovascular Disease

## 2023-11-10 VITALS — BP 147/58 | HR 68 | Ht 66.0 in | Wt 167.0 lb

## 2023-11-10 DIAGNOSIS — I82411 Acute embolism and thrombosis of right femoral vein: Secondary | ICD-10-CM

## 2023-11-10 DIAGNOSIS — I2510 Atherosclerotic heart disease of native coronary artery without angina pectoris: Secondary | ICD-10-CM | POA: Diagnosis present

## 2023-11-10 DIAGNOSIS — I4510 Unspecified right bundle-branch block: Secondary | ICD-10-CM

## 2023-11-10 DIAGNOSIS — R7303 Prediabetes: Secondary | ICD-10-CM

## 2023-11-10 DIAGNOSIS — E782 Mixed hyperlipidemia: Secondary | ICD-10-CM

## 2023-11-10 DIAGNOSIS — G7241 Inclusion body myositis [IBM]: Secondary | ICD-10-CM

## 2023-11-10 DIAGNOSIS — M5431 Sciatica, right side: Secondary | ICD-10-CM

## 2023-11-10 NOTE — Progress Notes (Signed)
.     Cardiology Office Note    Date:  11/11/2023   ID:  Timothy Gates, DOB 1946-09-13, MRN [id number]  PCP:  Merri Brunette, MD  Cardiologist:  Thurmon Fair, MD   Chief Complaint  Patient presents with   Coronary Artery Disease         History of Present Illness:  Timothy Gates is a 77 y.o. male with history of coronary artery disease and bypass surgery with all arterial conduits (sequential LIMA to LAD, Dr. 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    He was hospitalized in September 2024 with pulmonary embolism and acute DVT of the proximal right lower extremity, from the right common femoral vein down to the calf veins.  He presented with right calf swelling and tenderness.  Skin BX he is now on anticoagulants.  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  Incidentally noted aortic and coronary artery stenosis.  An echocardiogram performed during his recent hospitalization showed normal left ventricular systolic function, EF 65 to 70%, normal diastolic parameters, no serious valvular abnormalities, low normal right ventricular systolic function (Rv s' was only 6.4 which was actually placed in moderate to severely reduced systolic function).  He has no cardiovascular complaints and specifically denies any chest pain with activity or exertion (he has been less physically active due to his muscle problems).  He does not have any episodes of orthopnea, PND, exertional dyspnea, palpitations, syncope.  He does have orthostatic dizziness.  He tends to run a low diastolic blood pressure.  He has not had any new focal neurological complaints, but complains of sciatica type pain radiating down his right buttock and posterior thigh all the way to his foot.  He also complains of weakness in all  his muscles and unsteady gait.  He is using a walker.  It is difficult to get up out of a chair from a seated position.  He is unable to lift his arms above shoulder position they feel very heavy.  He has severe numbness in his feet and describes a feeling of "walking on cement blocks".  He has had several falls, thankfully without serious injuries.  He had minimal improvement in his sciatica pain and and no improvement in his muscle weakness while on treatment with prednisone.  He has had elevated CK enzyme for years now.  Stopping his statin for 3 months led to no change in his CK level or any improvement in his muscular symptoms.  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  Immunology workup did not show evidence of polymyositis.  His nerve conduction studies also show significant bilateral lumbar sacral radiculopathy, worse on the right especially in the L4-L5-S1 distribution and bilateral cervical radiculopathy in the distal cervical myotomes.  He has been on pravastatin for 24 years, ever since he had his bypass surgery.  He has not smoked since then either.         -  EMG nerve conduction study in May 2023 showed polyphasic motor unit potential but with mildly decreased recruitment patterns, in combination with abnormal muscle biopsy with evidence of inflammatory myopathic changes, oftentimes above abnormal EMG findings seen in chronic remodeling process, can be related to the chronic myopathic disease       -  Muscle biopsy of left vastus lateralis in July 2023 showed marked abnormality, inflammatory myopathy,there is some features also suggestive inclusion body myositis which is the most common myopathy at this age, particularly  the Cox negative fibers and the apparent ragged red fibers, but no apparent rimmed vacuoles are seen  Past Medical History:  Diagnosis Date   Anxiety    Arthritis    lower back    Balance problem    CAD (coronary artery disease)    Cancer (HCC)    melanoma   Depression    GERD (gastroesophageal reflux disease)    Hyperlipidemia    Hypertension    Iron deficiency anemia    Low back pain    OSA (obstructive sleep apnea)    Pneumonia    RBBB (right bundle branch block) 10/28/2019   Noted on EKG   Tinnitus    Left ear    Past Surgical History:  Procedure Laterality Date   CORONARY ARTERY BYPASS GRAFT  04/02/1999   double   DG BARIUM SWALLOW ([facility] HX)     FISSURECTOMY  2006   MUSCLE BIOPSY Left 06/21/2022   Procedure: LEFT THIGH MUSCLE BIOPSY;  Surgeon: Fritzi Mandes, MD;  Location: [facility];  Service: General;  Laterality: Left;   SHOULDER ARTHROSCOPY WITH ROTATOR CUFF REPAIR AND SUBACROMIAL DECOMPRESSION Left 03/22/2020   Procedure: RIGHT SHOULDER MINI OPEN ROTATOR CUFF REPAIR AND SUBACROMIAL DECOMPRESSION;  Surgeon: Jene Every, MD;  Location: [facility];  Service: Orthopedics;  Laterality: Left;  90 mins   SIGMOIDOSCOPY     SKIN CANCER EXCISION     UPPER GI ENDOSCOPY      Current Medications: Outpatient Medications Prior to Visit  Medication Sig Dispense Refill   Ascorbic Acid (VITAMIN C PO) Take 1 tablet by mouth daily.     Cholecalciferol (VITAMIN D3) 1000 units CAPS Take 1,000 Units by mouth daily.     finasteride (PROSCAR) 5 MG tablet Take 1 tablet by mouth daily.     metoprolol succinate (TOPROL-XL) 25 MG 24 hr tablet Take 1 tablet (25 mg total) by mouth daily after supper. 30 tablet 0   pantoprazole (PROTONIX) 40 MG tablet Take 40 mg by mouth daily.     rosuvastatin (CRESTOR) 20 MG tablet TAKE 1 TABLET BY MOUTH EVERY DAY (Patient taking differently: Take 20 mg by mouth daily.) 90 tablet 3   sertraline (ZOLOFT) 50 MG tablet Take 50 mg by mouth daily.     XARELTO 20 MG TABS tablet Take 20 mg by mouth daily.     zolpidem (AMBIEN) 10 MG tablet Take 10 mg by mouth at bedtime.     apixaban (ELIQUIS) 5 MG TABS tablet Take 5 mg 2 tablets twice a day for  7 days on Day 8 take 5 mg 1 tablet twice a day for total of 74 tablets. (Patient not taking: Reported on 11/10/2023)     APIXABAN (ELIQUIS) VTE STARTER PACK (10MG  AND 5MG ) Take as directed on package: start with two-5mg  tablets twice daily for 7 days. On day 8, switch to one-5mg  tablet twice daily. (Patient not taking: Reported on 11/10/2023) 74 each 0   aspirin 81 MG chewable tablet Chew 81 mg by mouth daily. (Patient not taking: Reported on 11/10/2023)     No facility-administered medications prior to visit.     Allergies:   Pravastatin, Benadryl [diphenhydramine hcl], Meloxicam, and Tramadol   Social History   Socioeconomic History   Marital status: Married    Spouse name: [name]  Number of children: 2   Years of education: Not on file   Highest education level: 9th grade  Occupational History   Not on file  Tobacco Use   Smoking status: Former    Current packs/day: 0.00    Types: Cigarettes    Quit date: 108    Years since quitting: 31.9   Smokeless tobacco: Former    Quit date: 11/24/1992  Vaping Use   Vaping status: Never Used  Substance and Sexual Activity   Alcohol use: No   Drug use: No   Sexual activity: Not on file  Other Topics Concern   Not on file  Social History Narrative   04/02/22 Lives with wife   Social Drivers of Health   Financial Resource Strain: Not on file  Food Insecurity: No Food Insecurity (08/18/2023)   Hunger Vital Sign    Worried About Running Out of Food in the Last Year: Never true    Ran Out of Food in the Last Year: Never true  Transportation Needs: No Transportation Needs (08/18/2023)   PRAPARE - Administrator, Civil Service (Medical): No    Lack of Transportation (Non-Medical): No  Physical Activity: Not on file  Stress: Not on file  Social Connections: Not on file     Family History:  The patient's family history includes Cancer in his brother, father, maternal grandfather, maternal grandmother, sister, and sister;  Diabetes in his mother; Heart attack in his paternal grandfather and paternal grandmother.   ROS:   Please see the history of present illness.    ROS all other systems are reviewed and are negative   PHYSICAL EXAM:   VS:  BP (!) 147/58   Pulse 68   Ht 5\' 6"  (1.676 m)   Wt 167 lb (75.8 kg)   SpO2 99%   BMI 26.95 kg/m      General: Alert, oriented x3, no distress, minimally overweight Head: no evidence of trauma, PERRL, EOMI, no exophtalmos or lid lag, no myxedema, no xanthelasma; normal ears, nose and oropharynx Neck: normal jugular venous pulsations and no hepatojugular reflux; brisk carotid pulses without delay and no carotid bruits Chest: clear to auscultation, no signs of consolidation by percussion or palpation, normal fremitus, symmetrical and full respiratory excursions Cardiovascular: normal position and quality of the apical impulse, regular rhythm, normal first and second heart sounds, no murmurs, rubs or gallops Abdomen: no tenderness or distention, no masses by palpation, no abnormal pulsatility or arterial bruits, normal bowel sounds, no hepatosplenomegaly Extremities: no clubbing, cyanosis or edema; 2+ radial, ulnar and brachial pulses bilaterally; 2+ right femoral, posterior tibial and dorsalis pedis pulses; 2+ left femoral, posterior tibial and dorsalis pedis pulses; no subclavian or femoral bruits Neurological: grossly nonfocal.  Uses his arms to push himself out of a chair to stand from seated position Psych: Normal mood and affect     Wt Readings from Last 3 Encounters:  11/10/23 167 lb (75.8 kg)  08/17/23 163 lb 9.6 oz (74.2 kg)  08/16/23 164 lb (74.4 kg)     Studies/Labs Reviewed:    EKG Interpretation Date/Time:    Ventricular Rate:    PR Interval:    QRS Duration:    QT Interval:    QTC Calculation:   R Axis:      Text Interpretation:         EKG:  Personally reviewed the ECG from 08/17/2023 which shows sinus rhythm, old right bundle branch  block, less significant right axis  deviation, normal QTc similar to previous tracings. Recent Labs: Reviewed report from his muscle biopsy. CK was persistently level between 564 and 1209 between January 2022 and June 2023 even when statins were discontinued for 3 months. Aldolase was elevated at 15.7 (04/02/2022).  LDH was 300-340 Last June CRP and ESR were normal.  Autoimmune workup including anticholinesterase receptor antibodies, ANA, anti-Jo1 antibodies were all negative.  Lipid Panel 10/04/2019 total cholesterol 158, triglycerides 117, HDL 29, LDL 92 03/30/2019 total cholesterol 144, triglycerides 232, HDL 27, LDL 71 10/09/2020 total cholesterol 167, triglycerides 155, HDL 32, LDL 107 06/01/2021 total cholesterol 115, triglycerides 166, HDL 27, LDL 60 10/21/2023 cholesterol 100.2, HDL 29, LDL 79, triglycerides 198, hemoglobin A1c 6.3%, hemoglobin 11.5, creatinine 0.8  Lipid Panel     Component Value Date/Time   CHOL 115 06/01/2021 0813   TRIG 166 (H) 06/01/2021 0813   HDL 27 (L) 06/01/2021 0813   CHOLHDL 4.3 06/01/2021 0813   LDLCALC 60 06/01/2021 0813   LABVLDL 28 06/01/2021 0813   BMET    Component Value Date/Time   NA 139 08/17/2023 1546   NA 142 04/02/2022 1038   K 4.1 08/17/2023 1546   CL 103 08/17/2023 1546   CO2 28 08/17/2023 1546   GLUCOSE 117 (H) 08/17/2023 1546   BUN 20 08/17/2023 1546   BUN 14 04/02/2022 1038   CREATININE 0.80 08/17/2023 1546   CALCIUM 8.7 (L) 08/17/2023 1546   EGFR 90.0 04/22/2023 1017   EGFR 90 04/02/2022 1038   GFRNONAA >60 08/17/2023 1546   GFRNONAA 89 07/15/2023 0840    ASSESSMENT:    1. Coronary artery disease involving native coronary artery of native heart without angina pectoris   2. Acute deep vein thrombosis (DVT) of femoral vein of right lower extremity (HCC)   3. Mixed hyperlipidemia   4. Prediabetes   5. RBBB   6. Sciatica, right side   7. Inclusion body myositis       PLAN:  In order of problems listed  above:  CAD s/p CABG: He used to have some symptoms suggestive of exertional angina but these have completely resolved, probably because he is sedentary.  On Eliquis, no longer on aspirin.  LDL cholesterol on statin is close to target range (<70).   DVT/chronic PE: This may be related to relative immobility due to his myopathy and neuromuscular disorder.  Suspect he will need to remain on lifelong anticoagulation.  He has not had any bleeding problems on Eliquis HLP: Has a chronically low HDL level.  Stopping the pravastatin led to no improvement in his muscle weakness complaints.  Since his LDL is not at goal, will try rosuvastatin 20 mg daily.  Check CK on follow-up labs. PreDM: Hemoglobin A1c is now in prediabetes range.  He is not on any medications.  Is likely that his insulin resistance has worsened with sedentary status.  He should try to avoid gaining weight. OSA: No longer using CPAP after weight loss. He does not have any symptoms of daytime hypersomnolence. RBBB(+/-LPFB): Stable findings on ECG, but the posterior fascicular block is intermittent.  No symptoms of high-grade AV conduction abnormalities. 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  He does not have evidence to support Cushing's disease, Eaton-Lambert syndrome or autoimmune polymyositis.  He may have inclusion body myositis in addition to cervical and lumbar radiculopathy and carpal tunnel syndrome.  Reviewed his ECG and echocardiogram again.  He does not have any findings that would support  a diagnosis of cardiac amyloidosis on ECG or echo.  He has declined referral to a specialist at [facility] but did see rheumatology here in town, without a clear plan for treatment.  Treatment with moderate to high dose of prednisone did not really help his sciatica complaints too much, and in the long run may worsen his muscle weakness.  Prognosis is poor.  Encouraged him to talk to his family about his  expectations for the future and make some end-of-life decisions early, not when in crisis. HTN: Tends to run a very low diastolic blood pressure and is prone to falls.  No adjustment was made today.  Medication Adjustments/Labs and Tests Ordered: Current medicines are reviewed at length with the patient today.  Concerns regarding medicines are outlined above.  Medication changes, Labs and Tests ordered today are listed in the Patient Instructions below. Patient Instructions  Medication Instructions:  No changes *If you need a refill on your cardiac medications before your next appointment, please call your pharmacy*  Follow-Up: At [facility], you and your health needs are our priority.  As part of our continuing mission to provide you with exceptional heart care, we have created designated Provider Care Teams.  These Care Teams include your primary Cardiologist (physician) and Advanced Practice Providers (APPs -  Physician Assistants and Nurse Practitioners) who all work together to provide you with the care you need, when you need it.  We recommend signing up for the patient portal called "MyChart".  Sign up information is provided on this After Visit Summary.  MyChart is used to connect with patients for Virtual Visits (Telemedicine).  Patients are able to view lab/test results, encounter notes, upcoming appointments, etc.  Non-urgent messages can be sent to your provider as well.   To learn more about what you can do with MyChart, go to [email or web address].    Your next appointment:   1 year(s)  Provider:   Thurmon Fair, MD       Signed, Thurmon Fair, MD  11/11/2023 8:41 AM    [facility] [address] [place], [place], [place]  [address] Phone: [phone]; Fax: [phone]

## 2023-11-10 NOTE — Patient Instructions (Signed)

## 2023-12-02 DIAGNOSIS — R7303 Prediabetes: Secondary | ICD-10-CM | POA: Diagnosis not present

## 2023-12-02 DIAGNOSIS — Z951 Presence of aortocoronary bypass graft: Secondary | ICD-10-CM | POA: Diagnosis not present

## 2023-12-02 DIAGNOSIS — I2510 Atherosclerotic heart disease of native coronary artery without angina pectoris: Secondary | ICD-10-CM | POA: Diagnosis not present

## 2023-12-02 DIAGNOSIS — R748 Abnormal levels of other serum enzymes: Secondary | ICD-10-CM | POA: Diagnosis not present

## 2023-12-02 DIAGNOSIS — F419 Anxiety disorder, unspecified: Secondary | ICD-10-CM | POA: Diagnosis not present

## 2023-12-02 DIAGNOSIS — E559 Vitamin D deficiency, unspecified: Secondary | ICD-10-CM | POA: Diagnosis not present

## 2023-12-02 DIAGNOSIS — G4700 Insomnia, unspecified: Secondary | ICD-10-CM | POA: Diagnosis not present

## 2023-12-02 DIAGNOSIS — Z86711 Personal history of pulmonary embolism: Secondary | ICD-10-CM | POA: Diagnosis not present

## 2023-12-02 DIAGNOSIS — E7801 Familial hypercholesterolemia: Secondary | ICD-10-CM | POA: Diagnosis not present

## 2023-12-02 DIAGNOSIS — R2681 Unsteadiness on feet: Secondary | ICD-10-CM | POA: Diagnosis not present

## 2023-12-24 DIAGNOSIS — R7401 Elevation of levels of liver transaminase levels: Secondary | ICD-10-CM | POA: Diagnosis not present

## 2023-12-24 DIAGNOSIS — R748 Abnormal levels of other serum enzymes: Secondary | ICD-10-CM | POA: Diagnosis not present

## 2023-12-24 DIAGNOSIS — K760 Fatty (change of) liver, not elsewhere classified: Secondary | ICD-10-CM | POA: Diagnosis not present

## 2024-01-13 DIAGNOSIS — K219 Gastro-esophageal reflux disease without esophagitis: Secondary | ICD-10-CM | POA: Diagnosis not present

## 2024-01-13 DIAGNOSIS — K117 Disturbances of salivary secretion: Secondary | ICD-10-CM | POA: Diagnosis not present

## 2024-01-13 DIAGNOSIS — R1310 Dysphagia, unspecified: Secondary | ICD-10-CM | POA: Diagnosis not present

## 2024-03-22 ENCOUNTER — Telehealth: Payer: Self-pay | Admitting: Neurology

## 2024-03-22 NOTE — Telephone Encounter (Signed)
 Appointment made

## 2024-04-23 DIAGNOSIS — M545 Low back pain, unspecified: Secondary | ICD-10-CM | POA: Diagnosis not present

## 2024-04-26 ENCOUNTER — Telehealth: Payer: Self-pay | Admitting: Neurology

## 2024-04-26 DIAGNOSIS — E7800 Pure hypercholesterolemia, unspecified: Secondary | ICD-10-CM | POA: Diagnosis not present

## 2024-04-26 DIAGNOSIS — R7303 Prediabetes: Secondary | ICD-10-CM | POA: Diagnosis not present

## 2024-04-26 DIAGNOSIS — I10 Essential (primary) hypertension: Secondary | ICD-10-CM | POA: Diagnosis not present

## 2024-04-26 DIAGNOSIS — Z0000 Encounter for general adult medical examination without abnormal findings: Secondary | ICD-10-CM | POA: Diagnosis not present

## 2024-04-26 DIAGNOSIS — R7301 Impaired fasting glucose: Secondary | ICD-10-CM | POA: Diagnosis not present

## 2024-04-26 DIAGNOSIS — E785 Hyperlipidemia, unspecified: Secondary | ICD-10-CM | POA: Diagnosis not present

## 2024-04-26 LAB — LAB REPORT - SCANNED: EGFR: 93

## 2024-04-26 NOTE — Telephone Encounter (Signed)
 Patient stating cancelling appointment because can not do it right now.

## 2024-05-03 DIAGNOSIS — Z0000 Encounter for general adult medical examination without abnormal findings: Secondary | ICD-10-CM | POA: Diagnosis not present

## 2024-05-03 DIAGNOSIS — Z86711 Personal history of pulmonary embolism: Secondary | ICD-10-CM | POA: Diagnosis not present

## 2024-05-03 DIAGNOSIS — E559 Vitamin D deficiency, unspecified: Secondary | ICD-10-CM | POA: Diagnosis not present

## 2024-05-03 DIAGNOSIS — E7800 Pure hypercholesterolemia, unspecified: Secondary | ICD-10-CM | POA: Diagnosis not present

## 2024-05-03 DIAGNOSIS — K760 Fatty (change of) liver, not elsewhere classified: Secondary | ICD-10-CM | POA: Diagnosis not present

## 2024-05-03 DIAGNOSIS — I2510 Atherosclerotic heart disease of native coronary artery without angina pectoris: Secondary | ICD-10-CM | POA: Diagnosis not present

## 2024-05-03 DIAGNOSIS — R7303 Prediabetes: Secondary | ICD-10-CM | POA: Diagnosis not present

## 2024-05-03 DIAGNOSIS — H7292 Unspecified perforation of tympanic membrane, left ear: Secondary | ICD-10-CM | POA: Diagnosis not present

## 2024-05-03 DIAGNOSIS — D509 Iron deficiency anemia, unspecified: Secondary | ICD-10-CM | POA: Diagnosis not present

## 2024-05-13 DIAGNOSIS — M5416 Radiculopathy, lumbar region: Secondary | ICD-10-CM | POA: Diagnosis not present

## 2024-05-26 DIAGNOSIS — L570 Actinic keratosis: Secondary | ICD-10-CM | POA: Diagnosis not present

## 2024-05-26 DIAGNOSIS — Z85828 Personal history of other malignant neoplasm of skin: Secondary | ICD-10-CM | POA: Diagnosis not present

## 2024-05-26 DIAGNOSIS — L821 Other seborrheic keratosis: Secondary | ICD-10-CM | POA: Diagnosis not present

## 2024-05-26 DIAGNOSIS — D1801 Hemangioma of skin and subcutaneous tissue: Secondary | ICD-10-CM | POA: Diagnosis not present

## 2024-05-26 DIAGNOSIS — D2239 Melanocytic nevi of other parts of face: Secondary | ICD-10-CM | POA: Diagnosis not present

## 2024-06-01 ENCOUNTER — Ambulatory Visit: Admitting: Neurology

## 2024-06-03 ENCOUNTER — Ambulatory Visit: Admitting: Neurology

## 2024-06-14 ENCOUNTER — Telehealth: Payer: Self-pay | Admitting: Cardiovascular Disease

## 2024-06-14 DIAGNOSIS — M5416 Radiculopathy, lumbar region: Secondary | ICD-10-CM | POA: Diagnosis not present

## 2024-06-14 DIAGNOSIS — M5459 Other low back pain: Secondary | ICD-10-CM | POA: Diagnosis not present

## 2024-06-14 NOTE — Telephone Encounter (Signed)
 Pt asked to have Dr Croitoru's last note to be sent to Dr Bonner noting that he has a history of Sciatica that is listed in Dr Fanny note... note routed.

## 2024-06-14 NOTE — Telephone Encounter (Signed)
 Pt requesting cb to discuss one of diagnosis. He forgot the name and wants to further discuss it.

## 2024-10-27 DIAGNOSIS — R7303 Prediabetes: Secondary | ICD-10-CM | POA: Diagnosis not present

## 2024-10-27 DIAGNOSIS — E7800 Pure hypercholesterolemia, unspecified: Secondary | ICD-10-CM | POA: Diagnosis not present

## 2024-10-27 DIAGNOSIS — D509 Iron deficiency anemia, unspecified: Secondary | ICD-10-CM | POA: Diagnosis not present

## 2024-10-27 LAB — LAB REPORT - SCANNED
A1c: 6.6
EGFR: 98
